# Patient Record
Sex: Female | Born: 2000 | Race: Black or African American | Hispanic: No | Marital: Single | Smoking: Never smoker
Health system: Southern US, Community
[De-identification: ages and names within clinical notes are randomized; demographics above are authoritative.]

## PROBLEM LIST (undated history)

## (undated) DIAGNOSIS — O99019 Anemia complicating pregnancy, unspecified trimester: Secondary | ICD-10-CM

## (undated) HISTORY — PX: NO PAST SURGERIES: SHX2092

---

## 2018-07-08 NOTE — L&D Delivery Note (Signed)
OB/GYN Faculty Practice Delivery Note  Alexandra Brady is a 18 y.o. G2P1001 s/p NSVD at [redacted]w[redacted]d. She was admitted for PPROM.   ROM: 34h 28m with clear fluid GBS Status: positive, adequate treatment with ampicillin   Maximum Maternal Temperature: 98.6 F    Labor Progress: . Patient arrived on 06/20/2019 at 3.5 cm dilation after PPROM for clear. She was initially monitored on L&D but after making no progress was moved to antepartum for monitoring. Early on 06/22/2019 patient started to have more painful contractions and was found to be 6 cm, she was moved to L&D and quickly had uncomplicated NSVD.  Delivery Date/Time: 06/22/2019 at 0547 Delivery: Called to room and patient was complete and pushing. Head delivered in ROA position. No nuchal cord present. Shoulder and body delivered in usual fashion. Infant with spontaneous cry, placed on mother's abdomen, dried and stimulated. Cord clamped x 2 after 1-minute delay, and cut by mother. Cord blood drawn. Placenta delivered spontaneously with gentle cord traction. Fundus firm with massage and Pitocin. Labia, perineum, vagina, and cervix inspected with hemostatic L periuretheral that was not repaired.   Placenta: 3v intact, to pathology Complications: none Lacerations: hemostatic L periuretheral that was not repaired EBL: 200 cc Analgesia: none   Infant: APGAR (1 MIN): 9   APGAR (5 MINS):  9  Weight: 2020 grams  Augustin Coupe, MD/[facility] OB/GYN Fellow, Faculty Practice

## 2019-04-22 LAB — OB RESULTS CONSOLE HEPATITIS B SURFACE ANTIGEN: Hepatitis B Surface Ag: NEGATIVE

## 2019-04-22 LAB — OB RESULTS CONSOLE HIV ANTIBODY (ROUTINE TESTING): HIV: NONREACTIVE

## 2019-04-22 LAB — OB RESULTS CONSOLE RUBELLA ANTIBODY, IGM: Rubella: IMMUNE

## 2019-04-22 LAB — OB RESULTS CONSOLE RPR: RPR: NONREACTIVE

## 2019-04-22 LAB — OB RESULTS CONSOLE GC/CHLAMYDIA
Chlamydia: NEGATIVE
Gonorrhea: NEGATIVE

## 2019-04-22 LAB — OB RESULTS CONSOLE ABO/RH: RH Type: POSITIVE

## 2019-04-22 LAB — OB RESULTS CONSOLE VARICELLA ZOSTER ANTIBODY, IGG: Varicella: NON-IMMUNE/NOT IMMUNE

## 2019-05-20 ENCOUNTER — Inpatient Hospital Stay (HOSPITAL_COMMUNITY)
Admission: EM | Admit: 2019-05-20 | Discharge: 2019-05-21 | Disposition: A | Discharge status: Home / Self Care | Payer: No Typology Code available for payment source | Attending: Obstetrics and Gynecology | Admitting: Obstetrics and Gynecology

## 2019-05-20 ENCOUNTER — Other Ambulatory Visit: Payer: Self-pay

## 2019-05-20 ENCOUNTER — Encounter (HOSPITAL_COMMUNITY): Payer: Self-pay | Admitting: *Deleted

## 2019-05-20 DIAGNOSIS — O368130 Decreased fetal movements, third trimester, not applicable or unspecified: Secondary | ICD-10-CM | POA: Insufficient documentation

## 2019-05-20 DIAGNOSIS — S3981XA Other specified injuries of abdomen, initial encounter: Secondary | ICD-10-CM

## 2019-05-20 DIAGNOSIS — Z3A30 30 weeks gestation of pregnancy: Secondary | ICD-10-CM

## 2019-05-20 DIAGNOSIS — S3991XA Unspecified injury of abdomen, initial encounter: Secondary | ICD-10-CM | POA: Insufficient documentation

## 2019-05-20 DIAGNOSIS — Z3A31 31 weeks gestation of pregnancy: Secondary | ICD-10-CM | POA: Diagnosis not present

## 2019-05-20 DIAGNOSIS — M545 Low back pain: Secondary | ICD-10-CM | POA: Diagnosis not present

## 2019-05-20 DIAGNOSIS — R109 Unspecified abdominal pain: Secondary | ICD-10-CM | POA: Insufficient documentation

## 2019-05-20 DIAGNOSIS — O9A213 Injury, poisoning and certain other consequences of external causes complicating pregnancy, third trimester: Secondary | ICD-10-CM | POA: Diagnosis not present

## 2019-05-20 DIAGNOSIS — O26893 Other specified pregnancy related conditions, third trimester: Secondary | ICD-10-CM | POA: Insufficient documentation

## 2019-05-20 DIAGNOSIS — Y990 Civilian activity done for income or pay: Secondary | ICD-10-CM | POA: Diagnosis not present

## 2019-05-20 LAB — URINALYSIS, ROUTINE W REFLEX MICROSCOPIC
Bilirubin Urine: NEGATIVE
Glucose, UA: NEGATIVE mg/dL
Hgb urine dipstick: NEGATIVE
Ketones, ur: NEGATIVE mg/dL
Nitrite: NEGATIVE
Protein, ur: NEGATIVE mg/dL
Specific Gravity, Urine: 1.018 (ref 1.005–1.030)
pH: 6 (ref 5.0–8.0)

## 2019-05-20 NOTE — MAU Note (Signed)
Pt up to BR

## 2019-05-20 NOTE — MAU Provider Note (Signed)
Chief Complaint:  Abdominal Injury   First Provider Initiated Contact with Patient 05/20/19 2258     HPI: Alexandra Brady is a 18 y.o. G2P1001 at 34w6dwho presents to maternity admissions reporting cramping and low back pain since having a box fall onto her abdomen at work tonight.  Was lifting it and it fell back onto her abdomen   . She reports decreased fetal movement, denies LOF, vaginal bleeding, vaginal itching/burning, urinary symptoms, h/a, dizziness, n/v, diarrhea, constipation or fever/chills.  .  Abdominal Pain This is a new problem. The current episode started today. The onset quality is sudden. The problem occurs intermittently. The quality of the pain is cramping. The abdominal pain radiates to the back. Pertinent negatives include no constipation, diarrhea, fever, myalgias or nausea. Nothing aggravates the pain. The pain is relieved by nothing. She has tried nothing for the symptoms.    RN note: About 1900 was lifting a box at work and it fell back against by abdomen. Box weighed 30-40lbs. About 27mins later started having some cramping in abdomen. Denies vag bleeding or d/c. Have not felt FM since going to work at [name] which is normal. States is busy at work and normally does not pay attention to FM. Baby was moving normally today before going to work at [name]. [facility]  Past Medical History: Past Medical History:  Diagnosis Date  . Medical history non-contributory     Past obstetric history: OB History  Gravida Para Term Preterm AB Living  2 1 1     1   SAB TAB Ectopic Multiple Live Births          1    # Outcome Date GA Lbr Len/2nd Weight Sex Delivery Anes PTL Lv  2 Current           1 Term 02/20/18    Greta Doom    Past Surgical History: Past Surgical History:  Procedure Laterality Date  . NO PAST SURGERIES      Family History: History reviewed. No pertinent family history.  Social History: Social History    Tobacco Use  . Smoking status: Never Smoker  . Smokeless tobacco: Never Used  Substance Use Topics  . Alcohol use: Never    Frequency: Never  . Drug use: Never    Allergies: Not on File  Meds:  Medications Prior to Admission  Medication Sig Dispense Refill Last Dose  . Prenatal Vit-Fe Fumarate-FA (PRENATAL MULTIVITAMIN) TABS tablet Take 1 tablet by mouth daily at 12 noon.       I have reviewed patient's Past Medical Hx, Surgical Hx, Family Hx, Social Hx, medications and allergies.   ROS:  Review of Systems  Constitutional: Negative for chills and fever.  Respiratory: Negative for shortness of breath.   Gastrointestinal: Positive for abdominal pain. Negative for constipation, diarrhea and nausea.  Genitourinary: Positive for pelvic pain. Negative for difficulty urinating and vaginal bleeding.  Musculoskeletal: Positive for back pain. Negative for myalgias.  Neurological: Negative for dizziness and weakness.   Other systems negative  Physical Exam   Patient Vitals for the past 24 hrs:  BP Temp Pulse Resp Height Weight  05/20/19 2221 126/66 98.1 F (36.7 C) 73 18 6' (1.829 m) 88.5 kg   Constitutional: Well-developed, well-nourished female in no acute distress.  Cardiovascular: normal rate and rhythm Respiratory: normal effort, clear to auscultation bilaterally GI: Abd soft, slightly-tender over upper abdomen, gravid appropriate for gestational age.  No rebound or guarding. MS: Extremities nontender, no edema, normal ROM Neurologic: Alert and oriented x 4.  GU: Neg CVAT.  FHT:  Baseline 140 , moderate variability, accelerations present, no decelerations Contractions: q 6-8 mins Irregular     Labs: Results for orders placed or performed during the hospital encounter of 05/20/19 (from the past 24 hour(s))  Urinalysis, Routine w reflex microscopic     Status: Abnormal   Collection Time: 05/20/19 10:30 PM  Result Value Ref Range   Color, Urine YELLOW YELLOW    APPearance HAZY (A) CLEAR   Specific Gravity, Urine 1.018 1.005 - 1.030   pH 6.0 5.0 - 8.0   Glucose, UA NEGATIVE NEGATIVE mg/dL   Hgb urine dipstick NEGATIVE NEGATIVE   Bilirubin Urine NEGATIVE NEGATIVE   Ketones, ur NEGATIVE NEGATIVE mg/dL   Protein, ur NEGATIVE NEGATIVE mg/dL   Nitrite NEGATIVE NEGATIVE   Leukocytes,Ua LARGE (A) NEGATIVE   RBC / HPF 11-20 0 - 5 RBC/hpf   WBC, UA 21-50 0 - 5 WBC/hpf   Bacteria, UA FEW (A) NONE SEEN   Squamous Epithelial / LPF 11-20 0 - 5   Mucus PRESENT    Budding Yeast PRESENT       Imaging:  No results found.  MAU Course/MDM: I have ordered labs and reviewed results.  Will send urine for culture. NST reviewed, noted to be having some contractions Consult Dr Alysia Penna with presentation, exam findings and test results. He recommends treating contractions and watching her for 4 hours Treatments in MAU included EFM, PO hydration  Over time contractions diminished.  We monitored her for 4 hours and contractions stopped Will discharge home with precautions.    Assessment: Single IUP at [redacted]w[redacted]d S/P blow to abdomen Preterm uterine contractions, resolved  Plan: Discharge home Preterm Labor precautions and fetal kick counts Follow up in Office for prenatal visits and recheck  Encouraged to return here or to other Urgent Care/ED if she develops worsening of symptoms, increase in pain, fever, or other concerning symptoms.   Pt stable at time of discharge.  Wynelle Bourgeois CNM, MSN Certified Nurse-Midwife 05/20/2019 10:58 PM

## 2019-05-20 NOTE — MAU Note (Addendum)
About 1900 was lifting a box at work and it fell back against by abdomen. Box weighed 30-40lbs. About 79mins later started having some cramping in abdomen. Denies vag bleeding or d/c. Have not felt FM since going to work at [employer] which is normal. States is busy at work and normally does not pay attention to FM. Baby was moving normally today before going to work at [employer]. [facility]

## 2019-05-21 NOTE — Discharge Instructions (Signed)

## 2019-05-21 NOTE — Progress Notes (Signed)
Written and verbal d/c instructions given and understanding voiced. To [facility] today to reschedule appt pt missed on Thurs

## 2019-05-23 LAB — CULTURE, OB URINE: Culture: 10000 — AB

## 2019-05-24 ENCOUNTER — Other Ambulatory Visit: Payer: Self-pay | Admitting: Obstetrics and Gynecology

## 2019-05-24 DIAGNOSIS — R8271 Bacteriuria: Secondary | ICD-10-CM

## 2019-05-24 MED ORDER — AMOXICILLIN 500 MG PO CAPS: 500.0000 mg | ORAL_CAPSULE | Freq: Three times a day (TID) | ORAL | 0 refills | Status: AC

## 2019-05-24 NOTE — Progress Notes (Signed)
Verified patient by name and DOB. Notified of (+) GBS on UCx and need for abx Rx. Verified no medication allergies. Patient verbalized an understanding of the plan of care and agrees. Rx sent.  Laury Deep, CNM

## 2019-05-27 ENCOUNTER — Encounter: Payer: Self-pay | Admitting: Advanced Practice Midwife

## 2019-05-27 DIAGNOSIS — R8271 Bacteriuria: Secondary | ICD-10-CM | POA: Insufficient documentation

## 2019-06-14 ENCOUNTER — Encounter (HOSPITAL_COMMUNITY): Payer: Self-pay | Admitting: Student

## 2019-06-14 ENCOUNTER — Inpatient Hospital Stay (HOSPITAL_COMMUNITY): Payer: Medicaid Other

## 2019-06-14 ENCOUNTER — Other Ambulatory Visit: Payer: Self-pay

## 2019-06-14 ENCOUNTER — Inpatient Hospital Stay (HOSPITAL_COMMUNITY)
Admission: AD | Admit: 2019-06-14 | Discharge: 2019-06-16 | DRG: 831 | Disposition: A | Discharge status: Home / Self Care | Payer: Medicaid Other | Attending: Obstetrics and Gynecology | Admitting: Obstetrics and Gynecology

## 2019-06-14 DIAGNOSIS — D649 Anemia, unspecified: Secondary | ICD-10-CM | POA: Diagnosis present

## 2019-06-14 DIAGNOSIS — O4693 Antepartum hemorrhage, unspecified, third trimester: Principal | ICD-10-CM | POA: Diagnosis present

## 2019-06-14 DIAGNOSIS — Z3A32 32 weeks gestation of pregnancy: Secondary | ICD-10-CM

## 2019-06-14 DIAGNOSIS — Z3A34 34 weeks gestation of pregnancy: Secondary | ICD-10-CM

## 2019-06-14 DIAGNOSIS — O99820 Streptococcus B carrier state complicating pregnancy: Secondary | ICD-10-CM | POA: Diagnosis present

## 2019-06-14 DIAGNOSIS — O99013 Anemia complicating pregnancy, third trimester: Secondary | ICD-10-CM | POA: Diagnosis present

## 2019-06-14 DIAGNOSIS — Z20828 Contact with and (suspected) exposure to other viral communicable diseases: Secondary | ICD-10-CM | POA: Diagnosis present

## 2019-06-14 HISTORY — DX: Anemia complicating pregnancy, unspecified trimester: O99.019

## 2019-06-14 LAB — CBC
HCT: 32.2 % — ABNORMAL LOW (ref 36.0–46.0)
Hemoglobin: 9.7 g/dL — ABNORMAL LOW (ref 12.0–15.0)
MCH: 23.3 pg — ABNORMAL LOW (ref 26.0–34.0)
MCHC: 30.1 g/dL (ref 30.0–36.0)
MCV: 77.4 fL — ABNORMAL LOW (ref 80.0–100.0)
Platelets: 316 10*3/uL (ref 150–400)
RBC: 4.16 MIL/uL (ref 3.87–5.11)
RDW: 16.4 % — ABNORMAL HIGH (ref 11.5–15.5)
WBC: 8.3 10*3/uL (ref 4.0–10.5)
nRBC: 0.2 % (ref 0.0–0.2)

## 2019-06-14 LAB — URINALYSIS, ROUTINE W REFLEX MICROSCOPIC
Bilirubin Urine: NEGATIVE
Glucose, UA: NEGATIVE mg/dL
Ketones, ur: NEGATIVE mg/dL
Nitrite: NEGATIVE
Protein, ur: NEGATIVE mg/dL
RBC / HPF: >50 RBC/hpf — ABNORMAL HIGH (ref 0–5)
Specific Gravity, Urine: 1.006 (ref 1.005–1.030)
pH: 8 (ref 5.0–8.0)

## 2019-06-14 LAB — WET PREP, GENITAL
Clue Cells Wet Prep HPF POC: NONE SEEN
Sperm: NONE SEEN
Trich, Wet Prep: NONE SEEN
Yeast Wet Prep HPF POC: NONE SEEN

## 2019-06-14 LAB — TYPE AND SCREEN
ABO/RH(D): A POS
Antibody Screen: NEGATIVE

## 2019-06-14 LAB — SARS CORONAVIRUS 2 (TAT 6-24 HRS): SARS Coronavirus 2: NEGATIVE

## 2019-06-14 LAB — ABO/RH: ABO/RH(D): A POS

## 2019-06-14 LAB — RPR: RPR Ser Ql: NONREACTIVE

## 2019-06-14 MED ORDER — LACTATED RINGERS IV SOLN: 500.0000 mL | INTRAVENOUS | Status: DC | PRN

## 2019-06-14 MED ORDER — ONDANSETRON HCL 4 MG/2ML IJ SOLN: 4.0000 mg | Freq: Four times a day (QID) | INTRAMUSCULAR | Status: DC | PRN

## 2019-06-14 MED ORDER — PENICILLIN G POT IN DEXTROSE 60000 UNIT/ML IV SOLN
3.0000 10*6.[IU] | INTRAVENOUS | Status: DC
  Administered 2019-06-14: 3 10*6.[IU] via INTRAVENOUS
  Filled 2019-06-14: qty 50

## 2019-06-14 MED ORDER — OXYTOCIN 40 UNITS IN NORMAL SALINE INFUSION - SIMPLE MED: 2.5000 [IU]/h | INTRAVENOUS | Status: DC

## 2019-06-14 MED ORDER — SOD CITRATE-CITRIC ACID 500-334 MG/5ML PO SOLN: 30.0000 mL | ORAL | Status: DC | PRN

## 2019-06-14 MED ORDER — PRENATAL MULTIVITAMIN CH
1.0000 | ORAL_TABLET | Freq: Every day | ORAL | Status: DC
  Administered 2019-06-14 – 2019-06-15 (×2): 1 via ORAL
  Filled 2019-06-14 (×2): qty 1

## 2019-06-14 MED ORDER — CALCIUM CARBONATE ANTACID 500 MG PO CHEW: 2.0000 | CHEWABLE_TABLET | ORAL | Status: DC | PRN

## 2019-06-14 MED ORDER — BETAMETHASONE SOD PHOS & ACET 6 (3-3) MG/ML IJ SUSP
12.0000 mg | INTRAMUSCULAR | Status: AC
  Administered 2019-06-14 – 2019-06-15 (×2): 12 mg via INTRAMUSCULAR
  Filled 2019-06-14: qty 5

## 2019-06-14 MED ORDER — LACTATED RINGERS IV SOLN: INTRAVENOUS | Status: DC

## 2019-06-14 MED ORDER — ACETAMINOPHEN 325 MG PO TABS: 650.0000 mg | ORAL_TABLET | ORAL | Status: DC | PRN

## 2019-06-14 MED ORDER — DOCUSATE SODIUM 100 MG PO CAPS
100.0000 mg | ORAL_CAPSULE | Freq: Every day | ORAL | Status: DC
  Administered 2019-06-14 – 2019-06-16 (×3): 100 mg via ORAL
  Filled 2019-06-14 (×3): qty 1

## 2019-06-14 MED ORDER — OXYTOCIN BOLUS FROM INFUSION: 500.0000 mL | Freq: Once | INTRAVENOUS | Status: DC

## 2019-06-14 MED ORDER — SODIUM CHLORIDE 0.9 % IV SOLN
5.0000 10*6.[IU] | Freq: Once | INTRAVENOUS | Status: AC
  Administered 2019-06-14: 5 10*6.[IU] via INTRAVENOUS
  Filled 2019-06-14: qty 5

## 2019-06-14 MED ORDER — FENTANYL CITRATE (PF) 100 MCG/2ML IJ SOLN: 50.0000 ug | INTRAMUSCULAR | Status: DC | PRN

## 2019-06-14 MED ORDER — LIDOCAINE HCL (PF) 1 % IJ SOLN: 30.0000 mL | INTRAMUSCULAR | Status: DC | PRN

## 2019-06-14 MED ORDER — NIFEDIPINE ER OSMOTIC RELEASE 30 MG PO TB24
30.0000 mg | ORAL_TABLET | Freq: Once | ORAL | Status: AC
  Administered 2019-06-14: 30 mg via ORAL
  Filled 2019-06-14: qty 1

## 2019-06-14 MED ORDER — NIFEDIPINE ER OSMOTIC RELEASE 30 MG PO TB24
30.0000 mg | ORAL_TABLET | Freq: Every day | ORAL | Status: DC
  Administered 2019-06-14 – 2019-06-16 (×3): 30 mg via ORAL
  Filled 2019-06-14 (×3): qty 1

## 2019-06-14 MED ORDER — LACTATED RINGERS IV SOLN
INTRAVENOUS | Status: DC
  Administered 2019-06-14 (×2): via INTRAVENOUS

## 2019-06-14 NOTE — MAU Provider Note (Signed)
Chief Complaint:  Vaginal Bleeding and Abdominal Pain   First Provider Initiated Contact with Patient 06/14/19 0330     HPI: Alexandra Brady is a 18 y.o. G2P1001 at [redacted]w[redacted]d based on 25 wk ultrasound who presents to maternity admissions reporting vaginal bleeding & abdominal pain. Symptoms started around 230 am. Reports bright red blood that saturated her shorts. Did not have to wear a pad to MAU, but when she arrived here still saw bright red blood on toilet paper. Not passing clots. Last intercourse was 11 pm last night. Has had constant lower abdominal pain since bleeding started that has waves of increasing intensity when baby moves.  Denies any recent abdominal trauma or falls.   Location: abdomen Quality: cramping Severity: 5/10 in pain scale Duration: 2 hours Timing: constant Modifying factors: none Associated signs and symptoms: vaginal bleeding  Pregnancy Course: [facility]. Late to care. Anemia. GBS in urine.   Past Medical History:  Diagnosis Date  . Anemia in pregnancy    OB History  Gravida Para Term Preterm AB Living  2 1 1     1   SAB TAB Ectopic Multiple Live Births          1    # Outcome Date GA Lbr Len/2nd Weight Sex Delivery Anes PTL Lv  2 Current           1 Term 02/20/18    02/22/18   Past Surgical History:  Procedure Laterality Date  . NO PAST SURGERIES     History reviewed. No pertinent family history. Social History   Tobacco Use  . Smoking status: Never Smoker  . Smokeless tobacco: Never Used  Substance Use Topics  . Alcohol use: Never    Frequency: Never  . Drug use: Never   No Known Allergies Medications Prior to Admission  Medication Sig Dispense Refill Last Dose  . Prenatal Vit-Fe Fumarate-FA (PRENATAL MULTIVITAMIN) TABS tablet Take 1 tablet by mouth daily at 12 noon.   Past Week at Unknown time    I have reviewed patient's Past Medical Hx, Surgical Hx, Family Hx, Social Hx, medications and allergies.   ROS:  Review of Systems   Constitutional: Negative.   Gastrointestinal: Positive for abdominal pain. Negative for constipation, diarrhea, nausea and vomiting.  Genitourinary: Positive for vaginal bleeding. Negative for dysuria and vaginal discharge.    Physical Exam   Patient Vitals for the past 24 hrs:  BP Temp Temp src Pulse Resp SpO2 Height Weight  06/14/19 0313 134/75 98.4 F (36.9 C) Oral 89 17 100 % 6' (1.829 m) 89.4 kg    Constitutional: Well-developed, well-nourished female in no acute distress.  Cardiovascular: normal rate & rhythm, no murmur Respiratory: normal effort, lung sounds clear throughout GI: Abd soft, non-tender, gravid appropriate for gestational age. Pos BS x 4 MS: Extremities nontender, no edema, normal ROM Neurologic: Alert and oriented x 4.  GU:      Pelvic: blood staining on vulva. Small amount of dark red blood in vault cleared out with 1 fox swab.. Slow ooze of mucoid dark red blood coming from cervical os.     Dilation: 3.5 Effacement (%): 50 Cervical Position: Posterior Station: -3 Presentation: Vertex Exam by:: [email or web address] NP  NST:  Baseline: 140 bpm, Variability: Good {> 6 bpm), Accelerations: Reactive and Decelerations: Absent   Labs: Results for orders placed or performed during the hospital encounter of 06/14/19 (from the past 24 hour(s))  Urinalysis, Routine w reflex microscopic  Status: Abnormal   Collection Time: 06/14/19  3:31 AM  Result Value Ref Range   Color, Urine YELLOW YELLOW   APPearance HAZY (A) CLEAR   Specific Gravity, Urine 1.006 1.005 - 1.030   pH 8.0 5.0 - 8.0   Glucose, UA NEGATIVE NEGATIVE mg/dL   Hgb urine dipstick LARGE (A) NEGATIVE   Bilirubin Urine NEGATIVE NEGATIVE   Ketones, ur NEGATIVE NEGATIVE mg/dL   Protein, ur NEGATIVE NEGATIVE mg/dL   Nitrite NEGATIVE NEGATIVE   Leukocytes,Ua SMALL (A) NEGATIVE   RBC / HPF >50 (H) 0 - 5 RBC/hpf   WBC, UA 11-20 0 - 5 WBC/hpf   Bacteria, UA RARE (A) NONE SEEN   Mucus PRESENT     Imaging:   No results found.  MAU Course: Orders Placed This Encounter  Procedures  . Wet prep, genital  . SARS CORONAVIRUS 2 (TAT 6-24 HRS) Nasopharyngeal Nasopharyngeal Swab  . [place] MFM OB LIMITED  . Urinalysis, Routine w reflex microscopic  . CBC  . RPR  . Type and screen [place]  . Insert and maintain IV Line   Meds ordered this encounter  Medications  . lactated ringers infusion  . betamethasone acetate-betamethasone sodium phosphate (CELESTONE) injection 12 mg    MDM: RH positive per prenatal record  Small to moderate amount of blood on spec exam, bleeding continues from os Ultrasound rules out previa. Cervix exam then performed & pt 3.5/50/-3.   Discussed patient with Dr. Harolyn Rutherford. Will admit to birthing suites due to cervical dilation & contractions.  Assessment: 1. Vaginal bleeding in pregnancy, third trimester   2. [redacted] weeks gestation of pregnancy     Plan: Admit to birthing suites per consult with Dr. Harolyn Rutherford PCN per protocol for GBS GC/CT & wet prep collected COVID swab pending BMZ ordered  Jorje Guild, NP 06/14/2019 4:34 AM

## 2019-06-14 NOTE — Progress Notes (Signed)
LABOR PROGRESS NOTE  Alexandra Brady is a 18 y.o. G2P1001 at [redacted]w[redacted]d  admitted for preterm labor.  Subjective: Not feeling contractions anymore  Objective: BP 105/61   Pulse 84   Temp 98.6 F (37 C) (Oral)   Resp 16   Ht 6' (1.829 m)   Wt 89.4 kg   SpO2 100%   BMI 26.75 kg/m  or  Vitals:   06/14/19 0830 06/14/19 0925 06/14/19 1010 06/14/19 1127  BP:      Pulse:      Resp: 16 18 16 16   Temp:      TempSrc:      SpO2:      Weight:      Height:         Dilation: 3 Effacement (%): 50 Cervical Position: Posterior Station: -3 Presentation: Vertex Exam by:: Dr Dione Plover FHT: baseline rate 120, moderate varibility, +acel, -decel Toco: none  Labs: Lab Results  Component Value Date   WBC 8.3 06/14/2019   HGB 9.7 (L) 06/14/2019   HCT 32.2 (L) 06/14/2019   MCV 77.4 (L) 06/14/2019   PLT 316 06/14/2019    Patient Active Problem List   Diagnosis Date Noted  . Preterm labor in third trimester 06/14/2019  . GBS bacteriuria 05/27/2019    Assessment / Plan: 18 y.o. G2P1001 at [redacted]w[redacted]d here for preterm labor.  Labor: no longer having contractions and no cervical change from last exam, will plan to transfer to antepartum. D/c penicillin prophylaxis, due for next betamethasone shot 06/15/2019 at 0454, likely can discontinue Nifedipine given lack of contractions. Consider NICU consult given threatened preterm delivery.  Fetal Wellbeing:  Cat I Pain Control:  IV pain meds PRN GBS: +UCx  Abnormal UA: follow up UCx results  Limited Prenatal Care: do not have results available for anatomy scan or third trimester screening labs including for GDM, obtain while on antepartum.   Augustin Coupe, MD/[facility] OB Fellow  06/14/2019, 11:30 AM

## 2019-06-14 NOTE — H&P (Signed)
Obstetric History and Physical  Alexandra Brady is a 18 y.o. G2P1001 with IUP at [redacted]w[redacted]d presenting for vaginal bleeding & possible preterm labor.  Presented to MAU this morning for vaginal bleeding. On exam had small amount of blood with continued bleeding from os. Normal limited ob ultrasound. Cervix dilated 3.5/50/-3.   Prenatal Course Source of Care: [facility]  with onset of care at 25 weeks Pregnancy complications or risks: Patient Active Problem List   Diagnosis Date Noted  . GBS bacteriuria 05/27/2019   She plans to breastfeed She desires undecided for postpartum contraception.   Prenatal labs and studies: ABO, Rh:  A positive Antibody:  negative Rubella:  immune RPR:   non reactive HBsAg:   non reactive HIV:   non reactive GBS: positive in urine 2hr GTT: pt refused gtt Genetic screening not done Anatomy US normal per patient  Prenatal Transfer Tool  Maternal Diabetes: pt declined testing Genetic Screening: late to care Maternal Ultrasounds/Referrals: Normal Fetal Ultrasounds or other Referrals:  None Maternal Substance Abuse:  No Significant Maternal Medications:  None Significant Maternal Lab Results: Group B Strep positive  Past Medical History:  Diagnosis Date  . Anemia in pregnancy     Past Surgical History:  Procedure Laterality Date  . NO PAST SURGERIES      OB History  Gravida Para Term Preterm AB Living  2 1 1     1   SAB TAB Ectopic Multiple Live Births          1    # Outcome Date GA Lbr Len/2nd Weight Sex Delivery Anes PTL Lv  2 Current           1 Term 02/20/18    M    LIV    Social History   Socioeconomic History  . Marital status: Single    Spouse name: Not on file  . Number of children: Not on file  . Years of education: Not on file  . Highest education level: Not on file  Occupational History  . Not on file  Social Needs  . Financial resource strain: Not on file  . Food insecurity    Worry: Not on file    Inability: Not on file   . Transportation needs    Medical: Not on file    Non-medical: Not on file  Tobacco Use  . Smoking status: Never Smoker  . Smokeless tobacco: Never Used  Substance and Sexual Activity  . Alcohol use: Never    Frequency: Never  . Drug use: Never  . Sexual activity: Yes    Birth control/protection: None  Lifestyle  . Physical activity    Days per week: Not on file    Minutes per session: Not on file  . Stress: Not on file  Relationships  . Social 02/22/18 on phone: Not on file    Gets together: Not on file    Attends religious service: Not on file    Active member of club or organization: Not on file    Attends meetings of clubs or organizations: Not on file    Relationship status: Not on file  Other Topics Concern  . Not on file  Social History Narrative  . Not on file    History reviewed. No pertinent family history.  Medications Prior to Admission  Medication Sig Dispense Refill Last Dose  . Prenatal Vit-Fe Fumarate-FA (PRENATAL MULTIVITAMIN) TABS tablet Take 1 tablet by mouth daily at 12 noon.   Past  Week at Unknown time    No Known Allergies  Review of Systems: Negative except for what is mentioned in HPI.  Physical Exam: BP 134/75 (BP Location: Left Arm)   Pulse 89   Temp 98.4 F (36.9 C) (Oral)   Resp 17   Ht 6' (1.829 m)   Wt 89.4 kg   SpO2 100%   BMI 26.75 kg/m  CONSTITUTIONAL: Well-developed, well-nourished female in no acute distress.  HENT:  Normocephalic, atraumatic, External right and left ear normal. Oropharynx is clear and moist EYES: Conjunctivae and EOM are normal. Pupils are equal, round, and reactive to light. No scleral icterus.  NECK: Normal range of motion, supple, no masses SKIN: Skin is warm and dry. No rash noted. Not diaphoretic. No erythema. No pallor. NEUROLOGIC: Alert and oriented to person, place, and time. Normal reflexes, muscle tone coordination. No cranial nerve deficit noted. PSYCHIATRIC: Normal mood and  affect. Normal behavior. Normal judgment and thought content. CARDIOVASCULAR: Normal heart rate noted, regular rhythm RESPIRATORY: Effort and breath sounds normal, no problems with respiration noted ABDOMEN: Soft, nontender, nondistended, gravid. MUSCULOSKELETAL: Normal range of motion. No edema and no tenderness. 2+ distal pulses.  Cervical Exam: Dilation: 3.5 Effacement (%): 50 Cervical Position: Posterior Station: -3 Presentation: Vertex Exam by:: Jorje Guild NP   Presentation: cephalic FHT:  NST:  Baseline: 140 bpm, Variability: Good {> 6 bpm), Accelerations: Reactive and Decelerations: Absent   Pertinent Labs/Studies:   Results for orders placed or performed during the hospital encounter of 06/14/19 (from the past 24 hour(s))  Urinalysis, Routine w reflex microscopic     Status: Abnormal   Collection Time: 06/14/19  3:31 AM  Result Value Ref Range   Color, Urine YELLOW YELLOW   APPearance HAZY (A) CLEAR   Specific Gravity, Urine 1.006 1.005 - 1.030   pH 8.0 5.0 - 8.0   Glucose, UA NEGATIVE NEGATIVE mg/dL   Hgb urine dipstick LARGE (A) NEGATIVE   Bilirubin Urine NEGATIVE NEGATIVE   Ketones, ur NEGATIVE NEGATIVE mg/dL   Protein, ur NEGATIVE NEGATIVE mg/dL   Nitrite NEGATIVE NEGATIVE   Leukocytes,Ua SMALL (A) NEGATIVE   RBC / HPF >50 (H) 0 - 5 RBC/hpf   WBC, UA 11-20 0 - 5 WBC/hpf   Bacteria, UA RARE (A) NONE SEEN   Mucus PRESENT     Assessment : Alexandra Brady is a 18 y.o. G2P1001 at [redacted]w[redacted]d being admitted for preterm labor & vaginal bleeding in third trimestser  Plan: Monitor for preterm labor  FWB: Reassuring fetal heart tracing.   GBS positive BMZ series ordered  Jorje Guild, NP

## 2019-06-14 NOTE — MAU Note (Addendum)
Pt presents to MAU complaining of vaginal bleeding since 0230, no pad count. Last intercourse last night 2330. No abnormal vaginal discharge or gushes of fluid. Constant lower abdominal pain since 0250. +FM.

## 2019-06-15 DIAGNOSIS — Z3A32 32 weeks gestation of pregnancy: Secondary | ICD-10-CM

## 2019-06-15 LAB — CULTURE, OB URINE: Culture: 10000 — AB

## 2019-06-15 LAB — GC/CHLAMYDIA PROBE AMP (~~LOC~~) NOT AT ARMC
Chlamydia: NEGATIVE
Comment: NEGATIVE
Comment: NORMAL
Neisseria Gonorrhea: NEGATIVE

## 2019-06-15 NOTE — Progress Notes (Signed)
Patient ID: Alexandra Brady, female   DOB: Oct 26, 2000, 18 y.o.   MRN: [id number] FACULTY PRACTICE ANTEPARTUM(COMPREHENSIVE) NOTE  Alexandra Brady is a 18 y.o. G2P1001 at [redacted]w[redacted]d  who is admitted for preterm contractions.    Fetal presentation is cephalic. Length of Stay:  1  Days  Date of admission:06/14/2019  Subjective: Patient reports feeling well. She denies any further contractions or pelvic pressure.  Patient reports the fetal movement as active. Patient reports uterine contraction  activity as none. Patient reports  vaginal bleeding as scant staining. Patient describes fluid per vagina as None.  Vitals:  Blood pressure (!) 112/52, pulse 85, temperature 98.5 F (36.9 C), temperature source Oral, resp. rate 18, height 6' (1.829 m), weight 89.4 kg, SpO2 100 %. Vitals:   06/15/19 0450 06/15/19 0455 06/15/19 0501 06/15/19 0826  BP:    (!) 112/52  Pulse:    85  Resp:    18  Temp:    98.5 F (36.9 C)  TempSrc:    Oral  SpO2: 100% 100% 100% 100%  Weight:      Height:       Physical Examination: GENERAL: Well-developed, well-nourished female in no acute distress.  LUNGS: Clear to auscultation bilaterally.  HEART: Regular rate and rhythm. ABDOMEN: Soft, nontender, gravid PELVIC: Not performed EXTREMITIES: No cyanosis, clubbing, or edema, 2+ distal pulses.    Fetal Monitoring:  Baseline: 130 bpm, Variability: Good {> 6 bpm), Accelerations: Reactive and Decelerations: Absent    No contractions on TOCO  Labs:  No results found for this or any previous visit (from the past 24 hour(s)).  Imaging Studies:    [place] Mfm Ob Limited  Result Date: 06/14/2019 ----------------------------------------------------------------------  OBSTETRICS REPORT                       (Signed Final 06/14/2019 08:00 am) ---------------------------------------------------------------------- Patient Info  ID #:       [id number]                          D.O.B.:  08/06/2000 (18 yrs)  Name:       Alexandra Brady                  Visit Date: 06/14/2019 03:59 am ---------------------------------------------------------------------- Performed By  Performed By:     Earley Brooke     Ref. Address:     [address]                    [place], [place]                                                             Road  Attending:        Noralee Space MD        Secondary Phy.:   MAU Nursing-                                                             MAU/Triage  Referred By:      Judeth Horn  [facility] ---------------------------------------------------------------------- Orders   #  Description                          Code         Ordered By   1  [place] MFM OB LIMITED                    [id number]     Jorje Guild  ----------------------------------------------------------------------   #  Order #                    Accession #                 Episode #   1  [id number]                  [id number]                  [id number]  ---------------------------------------------------------------------- Indications   Vaginal bleeding in pregnancy, third trimester O46.93   [redacted] weeks gestation of pregnancy                Z3A.34  ---------------------------------------------------------------------- Fetal Evaluation  Num Of Fetuses:         1  Fetal Heart Rate(bpm):  142  Cardiac Activity:       Observed  Presentation:           Cephalic  Placenta:               Posterior  P. Cord Insertion:      Visualized  Amniotic Fluid  AFI FV:      Within normal limits  AFI Sum(cm)     %Tile       Largest Pocket(cm)  15.9            57          5.82  RUQ(cm)       RLQ(cm)       LUQ(cm)        LLQ(cm)  3.95          5.82          3.57           2.56  Comment:    No placental abruption or previa identified sonographically. ---------------------------------------------------------------------- OB History  Gravidity:    2         Term:   1        Prem:   0        SAB:    0  TOP:          0       Ectopic:  0        Living: 1 ---------------------------------------------------------------------- Gestational Age  Clinical EDD:  34w 3d                                        EDD:   07/23/19  Best:  34w 3d     Det. By:  Clinical EDD             EDD:   07/23/19 ---------------------------------------------------------------------- Cervix Uterus Adnexa  Cervix  Not visualized (advanced GA >24wks)  Left Ovary  Within normal limits.  Right Ovary  Within normal limits.  Adnexa  No abnormality visualized. ---------------------------------------------------------------------- Impression  Patient was evaluated at the MAU for c/o vaginal bleeding  and abdominal pain.  A limited ultrasound study was performed. Amniotic fluid is  normal and good fetal activity is seen. No evidence of  placenta previa or abruption. Ultrasound has limitations in  diagnosing placental abruption. Cephalic presentation. ----------------------------------------------------------------------                  Noralee Spaceavi Shankar, MD Electronically Signed Final Report   06/14/2019 08:00 am ----------------------------------------------------------------------    Medications:  Scheduled . docusate sodium  100 mg Oral Daily  . NIFEdipine  30 mg Oral Daily  . oxytocin 40 units in LR 1000 mL  500 mL Intravenous Once  . prenatal multivitamin  1 tablet Oral Q1200   I have reviewed the patient's current medications.  ASSESSMENT:  Patient Active Problem List   Diagnosis Date Noted  . Preterm labor in third trimester 06/14/2019  . GBS bacteriuria 05/27/2019    PLAN: Patient completed BMZ this morning Continue inpatient observation Continue procardia Plan for discharge home tomorrow pending no cervical change and maternal/fetal status remains stable Continue current antepartum care  Edna Grover 06/15/2019,9:22 AM

## 2019-06-16 MED ORDER — NIFEDIPINE ER 30 MG PO TB24: 30.0000 mg | ORAL_TABLET | Freq: Every day | ORAL | 0 refills | Status: DC

## 2019-06-16 NOTE — Discharge Summary (Addendum)
OB Discharge Summary     Patient Name: Alexandra Brady DOB: 2000-10-22 MRN: [id number]  Date of admission: 06/14/2019 Delivering MD: This patient has no babies on file.  Date of discharge: 06/16/2019  Admitting diagnosis: 32WKS BLEEDING, PAIN IN BOTTOM OF STOMACH Intrauterine pregnancy: [redacted]w[redacted]d     Secondary diagnosis:  Active Problems:   Preterm labor in third trimester     Discharge diagnosis: preterm pregnancy not delivered                                   Hospital course:  Patient admitted with preterm labor and cervical change which halted at 3 cm. Patient completed a course of BMZ and was observed for 48 hours. Throughout her hospitalization maternal/fetal status remained stable. Patient denies any further episodes of regular contractions. She was discharged to continue procardia 30 mg XL daily until 36 weeks. Discharge instructions were reviewed. Patient provided work note to decrease her hours or the length of time she is standing  Physical exam  Vitals:   06/15/19 2110 06/15/19 2139 06/16/19 0653 06/16/19 0740  BP:  (!) 117/56 (!) 119/52 133/60  Pulse:  81 70 87  Resp:  17 16 18   Temp:  98.5 F (36.9 C) 98.5 F (36.9 C) 98.8 F (37.1 C)  TempSrc:  Oral Oral Oral  SpO2: 100% 100% 100% 100%  Weight:      Height:       GENERAL: Well-developed, well-nourished female in no acute distress.  LUNGS: Clear to auscultation bilaterally.  HEART: Regular rate and rhythm. ABDOMEN: Soft, nontender, gravid Cervix: 2.5/50/-3 unchanged since admission EXTREMITIES: No cyanosis, clubbing, or edema, 2+ distal pulses. NST: baseline 125, mod variability, +accels, no decels  Labs: Lab Results  Component Value Date   WBC 8.3 06/14/2019   HGB 9.7 (L) 06/14/2019   HCT 32.2 (L) 06/14/2019   MCV 77.4 (L) 06/14/2019   PLT 316 06/14/2019   No flowsheet data found.  Discharge instruction: per After Visit Summary and "Baby and Me Booklet".  After visit meds:  Allergies as of 06/16/2019    No Known Allergies     Medication List    TAKE these medications   FeroSul 325 (65 FE) MG tablet Generic drug: ferrous sulfate Take 325 mg by mouth 2 (two) times daily.   lansoprazole 15 MG capsule Commonly known as: PREVACID Take 15 mg by mouth daily as needed (heartburn).   NIFEdipine 30 MG 24 hr tablet Commonly known as: ADALAT CC Take 1 tablet (30 mg total) by mouth daily.   prenatal multivitamin Tabs tablet Take 1 tablet by mouth daily at 12 noon.       Diet: routine diet  Activity: Advance as tolerated. Pelvic rest for 6 weeks.   Outpatient follow up: for routine prenatal care on 12/16 Follow up Appt:No future appointments. Follow up Visit:No follow-ups on file.   06/16/2019 Mora Bellman, MD

## 2019-06-16 NOTE — Discharge Instructions (Signed)
Preterm Labor and Birth Information ° °The normal length of a pregnancy is 39-41 weeks. Preterm labor is when labor starts before 37 completed weeks of pregnancy. °What are the risk factors for preterm labor? °Preterm labor is more likely to occur in women who: °· Have certain infections during pregnancy such as a bladder infection, sexually transmitted infection, or infection inside the uterus (chorioamnionitis). °· Have a shorter-than-normal cervix. °· Have gone into preterm labor before. °· Have had surgery on their cervix. °· Are younger than age 17 or older than age 35. °· Are African American. °· Are pregnant with twins or multiple babies (multiple gestation). °· Take street drugs or smoke while pregnant. °· Do not gain enough weight while pregnant. °· Became pregnant shortly after having been pregnant. °What are the symptoms of preterm labor? °Symptoms of preterm labor include: °· Cramps similar to those that can happen during a menstrual period. The cramps may happen with diarrhea. °· Pain in the abdomen or lower back. °· Regular uterine contractions that may feel like tightening of the abdomen. °· A feeling of increased pressure in the pelvis. °· Increased watery or bloody mucus discharge from the vagina. °· Water breaking (ruptured amniotic sac). °Why is it important to recognize signs of preterm labor? °It is important to recognize signs of preterm labor because babies who are born prematurely may not be fully developed. This can put them at an increased risk for: °· Long-term (chronic) heart and lung problems. °· Difficulty immediately after birth with regulating body systems, including blood sugar, body temperature, heart rate, and breathing rate. °· Bleeding in the brain. °· Cerebral palsy. °· Learning difficulties. °· Death. °These risks are highest for babies who are born before 34 weeks of pregnancy. °How is preterm labor treated? °Treatment depends on the length of your pregnancy, your condition,  and the health of your baby. It may involve: °· Having a stitch (suture) placed in your cervix to prevent your cervix from opening too early (cerclage). °· Taking or being given medicines, such as: °? Hormone medicines. These may be given early in pregnancy to help support the pregnancy. °? Medicine to stop contractions. °? Medicines to help mature the baby’s lungs. These may be prescribed if the risk of delivery is high. °? Medicines to prevent your baby from developing cerebral palsy. °If the labor happens before 34 weeks of pregnancy, you may need to stay in the hospital. °What should I do if I think I am in preterm labor? °If you think that you are going into preterm labor, call your health care provider right away. °How can I prevent preterm labor in future pregnancies? °To increase your chance of having a full-term pregnancy: °· Do not use any tobacco products, such as cigarettes, chewing tobacco, and e-cigarettes. If you need help quitting, ask your health care provider. °· Do not use street drugs or medicines that have not been prescribed to you during your pregnancy. °· Talk with your health care provider before taking any herbal supplements, even if you have been taking them regularly. °· Make sure you gain a healthy amount of weight during your pregnancy. °· Watch for infection. If you think that you might have an infection, get it checked right away. °· Make sure to tell your health care provider if you have gone into preterm labor before. °This information is not intended to replace advice given to you by your health care provider. Make sure you discuss any questions you have with your   health care provider. °Document Released: 09/14/2003 Document Revised: 10/16/2018 Document Reviewed: 11/15/2015 °Elsevier Patient Education © 2020 [employer]vier Inc[employer] ° °

## 2019-06-20 ENCOUNTER — Inpatient Hospital Stay (HOSPITAL_COMMUNITY)
Admission: AD | Admit: 2019-06-20 | Discharge: 2019-06-24 | DRG: 807 | Disposition: A | Discharge status: Home / Self Care | Payer: Medicaid Other | Attending: Obstetrics and Gynecology | Admitting: Obstetrics and Gynecology

## 2019-06-20 ENCOUNTER — Other Ambulatory Visit: Payer: Self-pay

## 2019-06-20 ENCOUNTER — Encounter (HOSPITAL_COMMUNITY): Payer: Self-pay | Admitting: Obstetrics and Gynecology

## 2019-06-20 ENCOUNTER — Inpatient Hospital Stay (HOSPITAL_COMMUNITY): Payer: Medicaid Other

## 2019-06-20 DIAGNOSIS — D649 Anemia, unspecified: Secondary | ICD-10-CM | POA: Diagnosis present

## 2019-06-20 DIAGNOSIS — Z363 Encounter for antenatal screening for malformations: Secondary | ICD-10-CM | POA: Diagnosis not present

## 2019-06-20 DIAGNOSIS — O99824 Streptococcus B carrier state complicating childbirth: Secondary | ICD-10-CM | POA: Diagnosis present

## 2019-06-20 DIAGNOSIS — O0933 Supervision of pregnancy with insufficient antenatal care, third trimester: Secondary | ICD-10-CM | POA: Diagnosis not present

## 2019-06-20 DIAGNOSIS — O9902 Anemia complicating childbirth: Secondary | ICD-10-CM | POA: Diagnosis present

## 2019-06-20 DIAGNOSIS — R8271 Bacteriuria: Secondary | ICD-10-CM | POA: Diagnosis present

## 2019-06-20 DIAGNOSIS — Z3A33 33 weeks gestation of pregnancy: Secondary | ICD-10-CM | POA: Diagnosis not present

## 2019-06-20 DIAGNOSIS — O42919 Preterm premature rupture of membranes, unspecified as to length of time between rupture and onset of labor, unspecified trimester: Secondary | ICD-10-CM | POA: Diagnosis present

## 2019-06-20 DIAGNOSIS — Z20828 Contact with and (suspected) exposure to other viral communicable diseases: Secondary | ICD-10-CM | POA: Diagnosis present

## 2019-06-20 DIAGNOSIS — O4290 Premature rupture of membranes, unspecified as to length of time between rupture and onset of labor, unspecified weeks of gestation: Secondary | ICD-10-CM

## 2019-06-20 DIAGNOSIS — O42913 Preterm premature rupture of membranes, unspecified as to length of time between rupture and onset of labor, third trimester: Secondary | ICD-10-CM | POA: Diagnosis present

## 2019-06-20 DIAGNOSIS — O42113 Preterm premature rupture of membranes, onset of labor more than 24 hours following rupture, third trimester: Secondary | ICD-10-CM | POA: Diagnosis not present

## 2019-06-20 LAB — CBC
HCT: 28.2 % — ABNORMAL LOW (ref 36.0–46.0)
Hemoglobin: 8.6 g/dL — ABNORMAL LOW (ref 12.0–15.0)
MCH: 23.1 pg — ABNORMAL LOW (ref 26.0–34.0)
MCHC: 30.5 g/dL (ref 30.0–36.0)
MCV: 75.8 fL — ABNORMAL LOW (ref 80.0–100.0)
Platelets: 320 10*3/uL (ref 150–400)
RBC: 3.72 MIL/uL — ABNORMAL LOW (ref 3.87–5.11)
RDW: 16.5 % — ABNORMAL HIGH (ref 11.5–15.5)
WBC: 12.2 10*3/uL — ABNORMAL HIGH (ref 4.0–10.5)
nRBC: 0.2 % (ref 0.0–0.2)

## 2019-06-20 LAB — URINALYSIS, ROUTINE W REFLEX MICROSCOPIC
Bacteria, UA: NONE SEEN
Bilirubin Urine: NEGATIVE
Glucose, UA: NEGATIVE mg/dL
Ketones, ur: NEGATIVE mg/dL
Nitrite: NEGATIVE
Protein, ur: NEGATIVE mg/dL
Specific Gravity, Urine: 1.012 (ref 1.005–1.030)
pH: 6 (ref 5.0–8.0)

## 2019-06-20 LAB — COMPREHENSIVE METABOLIC PANEL
ALT: 18 U/L (ref 0–44)
AST: 19 U/L (ref 15–41)
Albumin: 2.7 g/dL — ABNORMAL LOW (ref 3.5–5.0)
Alkaline Phosphatase: 93 U/L (ref 38–126)
Anion gap: 10 (ref 5–15)
BUN: 8 mg/dL (ref 6–20)
CO2: 19 mmol/L — ABNORMAL LOW (ref 22–32)
Calcium: 9 mg/dL (ref 8.9–10.3)
Chloride: 106 mmol/L (ref 98–111)
Creatinine, Ser: 0.48 mg/dL (ref 0.44–1.00)
GFR calc Af Amer: >60 mL/min (ref >60)
GFR calc non Af Amer: >60 mL/min (ref >60)
Glucose, Bld: 105 mg/dL — ABNORMAL HIGH (ref 70–99)
Potassium: 3.5 mmol/L (ref 3.5–5.1)
Sodium: 135 mmol/L (ref 135–145)
Total Bilirubin: 0.5 mg/dL (ref 0.3–1.2)
Total Protein: 6.8 g/dL (ref 6.5–8.1)

## 2019-06-20 LAB — RAPID URINE DRUG SCREEN, HOSP PERFORMED
Amphetamines: NOT DETECTED
Barbiturates: NOT DETECTED
Benzodiazepines: NOT DETECTED
Cocaine: NOT DETECTED
Opiates: NOT DETECTED
Tetrahydrocannabinol: NOT DETECTED

## 2019-06-20 LAB — POCT FERN TEST: POCT Fern Test: POSITIVE

## 2019-06-20 LAB — TYPE AND SCREEN
ABO/RH(D): A POS
Antibody Screen: NEGATIVE

## 2019-06-20 LAB — GLUCOSE, CAPILLARY: Glucose-Capillary: 102 mg/dL — ABNORMAL HIGH (ref 70–99)

## 2019-06-20 MED ORDER — LACTATED RINGERS IV SOLN
INTRAVENOUS | Status: DC
  Administered 2019-06-20 – 2019-06-21 (×2): via INTRAVENOUS

## 2019-06-20 MED ORDER — ACETAMINOPHEN 325 MG PO TABS: 650.0000 mg | ORAL_TABLET | ORAL | Status: DC | PRN

## 2019-06-20 MED ORDER — FENTANYL CITRATE (PF) 100 MCG/2ML IJ SOLN
50.0000 ug | INTRAMUSCULAR | Status: DC | PRN
  Administered 2019-06-21 (×2): 100 ug via INTRAVENOUS
  Filled 2019-06-20 (×2): qty 2

## 2019-06-20 MED ORDER — OXYTOCIN BOLUS FROM INFUSION: 500.0000 mL | Freq: Once | INTRAVENOUS | Status: DC

## 2019-06-20 MED ORDER — AMOXICILLIN 500 MG PO CAPS: 500.0000 mg | ORAL_CAPSULE | Freq: Three times a day (TID) | ORAL | Status: DC

## 2019-06-20 MED ORDER — SODIUM CHLORIDE 0.9 % IV SOLN
2.0000 g | Freq: Four times a day (QID) | INTRAVENOUS | Status: DC
  Administered 2019-06-20 – 2019-06-22 (×6): 2 g via INTRAVENOUS
  Filled 2019-06-20 (×7): qty 2000

## 2019-06-20 MED ORDER — SOD CITRATE-CITRIC ACID 500-334 MG/5ML PO SOLN: 30.0000 mL | ORAL | Status: DC | PRN

## 2019-06-20 MED ORDER — AZITHROMYCIN 500 MG PO TABS
1000.0000 mg | ORAL_TABLET | Freq: Once | ORAL | Status: AC
  Administered 2019-06-21: 1000 mg via ORAL
  Filled 2019-06-20: qty 2

## 2019-06-20 MED ORDER — OXYTOCIN 40 UNITS IN NORMAL SALINE INFUSION - SIMPLE MED: 2.5000 [IU]/h | INTRAVENOUS | Status: DC

## 2019-06-20 MED ORDER — PENICILLIN G POT IN DEXTROSE 60000 UNIT/ML IV SOLN: 3.0000 10*6.[IU] | INTRAVENOUS | Status: DC

## 2019-06-20 MED ORDER — LIDOCAINE HCL (PF) 1 % IJ SOLN: 30.0000 mL | INTRAMUSCULAR | Status: DC | PRN

## 2019-06-20 MED ORDER — ONDANSETRON HCL 4 MG/2ML IJ SOLN
4.0000 mg | Freq: Four times a day (QID) | INTRAMUSCULAR | Status: DC | PRN
  Administered 2019-06-21: 4 mg via INTRAVENOUS
  Filled 2019-06-20: qty 2

## 2019-06-20 MED ORDER — SODIUM CHLORIDE 0.9 % IV SOLN
2.0000 g | Freq: Four times a day (QID) | INTRAVENOUS | Status: DC
  Filled 2019-06-20: qty 2000

## 2019-06-20 MED ORDER — LACTATED RINGERS IV SOLN: 500.0000 mL | INTRAVENOUS | Status: DC | PRN

## 2019-06-20 NOTE — H&P (Signed)
LABOR AND DELIVERY ADMISSION HISTORY AND PHYSICAL NOTE  Alexandra Brady is a 18 y.o. female G2P1001 with IUP at [redacted]w[redacted]d presenting for SROM.   Around 1940 had gush of clear fluid and has had ongoing leaking.  Denies any pain, occasional pain in back that she thinks might be a contraction  Recent admission 12/7-12/9 for preterm labor, d/c on Procardia and she reports compliance with this medication with last dose this morning  She reports positive fetal movement. She denies vaginal bleeding.   She plans on bottle feeding. She uncertain about birth control.  Prenatal History/Complications: PNC at [facility]  Sono:  @[redacted]w[redacted]d , cephalic presentation, posterior placenta  Pregnancy complications:  - Preterm labor admission 06/14/2019 - 06/16/2019, given BMZ x2 and observed, did not make cervical change and was discharged on procardia  Past Medical History: Past Medical History:  Diagnosis Date  . Anemia in pregnancy     Past Surgical History: Past Surgical History:  Procedure Laterality Date  . NO PAST SURGERIES      Obstetrical History: OB History    Gravida  2   Para  1   Term  1   Preterm      AB      Living  1     SAB      TAB      Ectopic      Multiple      Live Births  1           Social History: Social History   Socioeconomic History  . Marital status: Single    Spouse name: Not on file  . Number of children: Not on file  . Years of education: Not on file  . Highest education level: Not on file  Occupational History  . Not on file  Tobacco Use  . Smoking status: Never Smoker  . Smokeless tobacco: Never Used  Substance and Sexual Activity  . Alcohol use: Never  . Drug use: Never  . Sexual activity: Yes    Birth control/protection: None  Other Topics Concern  . Not on file  Social History Narrative  . Not on file   Social Determinants of Health   Financial Resource Strain:   . Difficulty of Paying Living Expenses: Not on file  Food  Insecurity:   . Worried About 14/03/2019 in the Last Year: Not on file  . Ran Out of Food in the Last Year: Not on file  Transportation Needs:   . Lack of Transportation (Medical): Not on file  . Lack of Transportation (Non-Medical): Not on file  Physical Activity:   . Days of Exercise per Week: Not on file  . Minutes of Exercise per Session: Not on file  Stress:   . Feeling of Stress : Not on file  Social Connections:   . Frequency of Communication with Friends and Family: Not on file  . Frequency of Social Gatherings with Friends and Family: Not on file  . Attends Religious Services: Not on file  . Active Member of Clubs or Organizations: Not on file  . Attends Programme researcher, broadcasting/film/video Meetings: Not on file  . Marital Status: Not on file    Family History: History reviewed. No pertinent family history.  Allergies: No Known Allergies  Medications Prior to Admission  Medication Sig Dispense Refill Last Dose  . lansoprazole (PREVACID) 15 MG capsule Take 15 mg by mouth daily as needed (heartburn).   06/19/2019 at Unknown time  . NIFEdipine (ADALAT CC)  30 MG 24 hr tablet Take 1 tablet (30 mg total) by mouth daily. 30 tablet 0 06/20/2019 at Unknown time  . Prenatal Vit-Fe Fumarate-FA (PRENATAL MULTIVITAMIN) TABS tablet Take 1 tablet by mouth daily at 12 noon.   06/20/2019 at Unknown time  . FEROSUL 325 (65 Fe) MG tablet Take 325 mg by mouth 2 (two) times daily.        Review of Systems  All systems reviewed and negative except as stated in HPI  Physical Exam Blood pressure 129/73, pulse 95, temperature 98.9 F (37.2 C), temperature source Oral, resp. rate 19, height 6' (1.829 m), weight 88.5 kg, SpO2 97 %. General appearance: alert, oriented, NAD Lungs: normal respiratory effort Heart: regular rate Abdomen: soft, non-tender; gravid Extremities: No calf swelling or tenderness Presentation: cephalic by US and my SVE Fetal monitoringBaseline: 145 bpm, Variability: Good {>  6 bpm), Accelerations: Reactive and Decelerations: Absent Uterine activityFrequency: Every 4-5 minutes    Prenatal labs: ABO, Rh: --/--/A POS, A POS Performed at [facility], [address] [place], [place] [address]  [phone](12/07 0447) Antibody: NEG (12/07 0447) Rubella: Immune (10/15 0000) RPR: NON REACTIVE (12/07 0447)  HBsAg: Negative (10/15 0000)  HIV: Non-reactive (10/15 0000)  GC/Chlamydia: neg/neg 06/14/2019  GBS:   positive by Urine culture 2-hr GTT: declined, A1c 5.5% on 05/24/2019 Genetic screening:  None in records Anatomy US: no records in media or other chart. Reports had ultrasound and told infant needed follow up scan to look at "possibly enlarged heart"  Prenatal Transfer Tool  Maternal Diabetes: No Genetic Screening: Declined Maternal Ultrasounds/Referrals: Other:no records available Fetal Ultrasounds or other Referrals:  None Maternal Substance Abuse:  No Significant Maternal Medications:  None Significant Maternal Lab Results: Group B Strep positive  Results for orders placed or performed during the hospital encounter of 06/20/19 (from the past 24 hour(s))  Fern Test   Collection Time: 06/20/19  8:42 PM  Result Value Ref Range   POCT Fern Test Positive = ruptured amniotic membanes     Patient Active Problem List   Diagnosis Date Noted  . Preterm labor in third trimester 06/14/2019  . GBS bacteriuria 05/27/2019    Assessment: Alexandra PrudeStanasia Brady is a 18 y.o. G2P1001 at 7545w2d here for PPROM.  #PPROM: Grossly ruptured and fern positive. - wet prep - GN/CT - UDS - latency antibiotics - GBS positive, will be covered by latency antibiotics initially, also written for Penicillin G to start after latency ampicillin is completed  #Labor: contracting but not uncomfortable, no cervical change since discharge, overall does not appear to be in labor. Cephalic by US. Defer further tocolysis at this point, monitor on L&D for time being, if no further progress  overnight [facility]. Does report prior term 37wk vaginal delivery with last pregnancy that occurred shortly after ROM. #Pain: IV pain meds PRN, epidural if advances in labor #FWB:  Cat I #GBS/ID: Positive by Urine Cx, covered for GBS by latency abx and also written for Penicillin G to start once those are finished #COVID: swab negative on 06/14/2019, repeat 6-24h test sent as not in active labor #MOF: Bottle #MOC: Unsure #Circ: yes, outpatient  #?abnormal fetal anatomy scan: Reports ?enlargement of fetal heart with follow up scan scheduled that did not occur. Unable to view any prenatal ultrasound records beyond limited US done during last admission - Growth scan ordered  #Dating: Per patient and prenatal records LMP was unsure and dating was based on anatomy scan done at ~  24 weeks, unfortunately the result is not in her HD paperwork. Prior US here done last week appears to place patient at incorrect EDD, will call us to ensure dating is entered for EDD 08/06/2019 to ensure accurate growth measurements and call HD in morning to request records for anatomy scan on which dating is based.  Annice Needy [facility] 06/20/2019, 9:03 PM

## 2019-06-20 NOTE — MAU Note (Signed)
SROM at 1930-clear fluid.  Stated she had some blood in her mucous plug earlier but no frank bleeding now.  Some light contractions that are irregular.  + FM.

## 2019-06-21 ENCOUNTER — Encounter (HOSPITAL_COMMUNITY): Payer: Self-pay | Admitting: Obstetrics and Gynecology

## 2019-06-21 LAB — WET PREP, GENITAL
Clue Cells Wet Prep HPF POC: NONE SEEN
Sperm: NONE SEEN
Trich, Wet Prep: NONE SEEN

## 2019-06-21 LAB — GLUCOSE, CAPILLARY
Glucose-Capillary: 82 mg/dL (ref 70–99)
Glucose-Capillary: 76 mg/dL (ref 70–99)

## 2019-06-21 LAB — SARS CORONAVIRUS 2 (TAT 6-24 HRS): SARS Coronavirus 2: NEGATIVE

## 2019-06-21 LAB — RPR: RPR Ser Ql: NONREACTIVE

## 2019-06-21 MED ORDER — CALCIUM CARBONATE ANTACID 500 MG PO CHEW: 2.0000 | CHEWABLE_TABLET | ORAL | Status: DC | PRN

## 2019-06-21 MED ORDER — ACETAMINOPHEN 325 MG PO TABS
650.0000 mg | ORAL_TABLET | ORAL | Status: DC | PRN
  Administered 2019-06-21 – 2019-06-22 (×2): 650 mg via ORAL
  Filled 2019-06-21 (×2): qty 2

## 2019-06-21 MED ORDER — SODIUM CHLORIDE 0.9 % IV SOLN: 250.0000 mL | INTRAVENOUS | Status: DC | PRN

## 2019-06-21 MED ORDER — FLUCONAZOLE 150 MG PO TABS
150.0000 mg | ORAL_TABLET | Freq: Once | ORAL | Status: AC
  Administered 2019-06-21: 150 mg via ORAL
  Filled 2019-06-21: qty 1

## 2019-06-21 MED ORDER — PRENATAL MULTIVITAMIN CH
1.0000 | ORAL_TABLET | Freq: Every day | ORAL | Status: DC
  Administered 2019-06-21: 1 via ORAL
  Filled 2019-06-21: qty 1

## 2019-06-21 MED ORDER — DOCUSATE SODIUM 100 MG PO CAPS
100.0000 mg | ORAL_CAPSULE | Freq: Every day | ORAL | Status: DC
  Administered 2019-06-21: 100 mg via ORAL
  Filled 2019-06-21: qty 1

## 2019-06-21 MED ORDER — SODIUM CHLORIDE 0.9% FLUSH: 3.0000 mL | INTRAVENOUS | Status: DC | PRN

## 2019-06-21 MED ORDER — SODIUM CHLORIDE 0.9% FLUSH: 3.0000 mL | Freq: Two times a day (BID) | INTRAVENOUS | Status: DC

## 2019-06-21 MED ORDER — ZOLPIDEM TARTRATE 5 MG PO TABS
5.0000 mg | ORAL_TABLET | Freq: Every evening | ORAL | Status: DC | PRN
  Administered 2019-06-22: 5 mg via ORAL
  Filled 2019-06-21: qty 1

## 2019-06-21 NOTE — Consult Note (Signed)
Neonatology Consult  Note:  At the request of the patients obstetrician Dr. Rosana Hoes I met with Alexandra Brady who is a 18 y.o. female G2P1001 with IUP at 100w3dwho presented with PPROM.  She was recently admitted 12/7-12/9 for preterm labor, discharged on  Procardia. Pregnancy complicated by GBS bacturia, abnormal fetal anatomy scan with reports of questionable enlargement of fetal heart - growth scan ordered with final read pending.  She is currently being treated with latency antibiotics.  We reviewed initial delivery room management, including CPAP, [place], and low but certainly possible need for intubation for surfactant administration.  We discussed feeding immaturity and need for full po intake with multiple days of good weight gain and no apnea or bradycardia before discharge.  We reviewed increased risk of jaundice, infection, and temperature instability.   Discussed likely length of stay.  Thank you for allowing [place] participate in her care.  Please call with questions.  BHiginio Roger DO  Neonatologist   The total length of face-to-face or floor / unit time for this encounter was 20 minutes.  Counseling and / or coordination of care was greater than fifty percent of the time.

## 2019-06-21 NOTE — Progress Notes (Addendum)
LABOR PROGRESS NOTE  Alexandra Brady is a 18 y.o. G2P1001 at [redacted]w[redacted]d  admitted for PPROM.  Subjective: sleeping  Objective: BP (!) 103/55   Pulse 85   Temp 98.5 F (36.9 C) (Oral)   Resp 18   Ht 6' (1.829 m)   Wt 88.5 kg   SpO2 97%   BMI 26.46 kg/m  or  Vitals:   06/21/19 0142 06/21/19 0235 06/21/19 0348 06/21/19 0604  BP:   (!) 114/58 (!) 103/55  Pulse:   72 85  Resp: 16 16 16 18   Temp:  98.3 F (36.8 C) 98.5 F (36.9 C)   TempSrc:  Oral Oral   SpO2:      Weight:      Height:         Dilation: 3.5 Effacement (%): 50 Station: -2 Presentation: Vertex(confirmed via bedside US) Exam by:: Dr. Dione Plover FHT: baseline rate 120, moderate varibility, +acel, -decel Toco: irregular  Labs: Lab Results  Component Value Date   WBC 12.2 (H) 06/20/2019   HGB 8.6 (L) 06/20/2019   HCT 28.2 (L) 06/20/2019   MCV 75.8 (L) 06/20/2019   PLT 320 06/20/2019    Patient Active Problem List   Diagnosis Date Noted  . Preterm premature rupture of membranes 06/20/2019  . Preterm labor in third trimester 06/14/2019  . GBS bacteriuria 05/27/2019    Assessment / Plan: 18 y.o. G2P1001 at [redacted]w[redacted]d here for PPROM.  Labor: widely spaced contractions, patient currently sleeping, does not appear to be progressing in labor at present, defer cervical exam until day shift. Fetal Wellbeing:  Cat I Pain Control:  IV pain meds PRN GBS: positive by Urine, on ampicillin for latency abx/GBS coverage Anticipated MOD:  SVD  #PPROM: Grossly ruptured and fern positive. - wet prep>positive for yeast, treated w fluconazole - GN/CT>in process - UDS>neg - on latency antibiotics - GBS positive, will cont ampicillin indefinitely  #Scant prenatal care: no GDM testing done, initial CBG 102>82, cont q4h check for 1-2 more checks, can d/c if cont to be normal  #?abnormal fetal anatomy scan: Reports ?enlargement of fetal heart with follow up scan scheduled that did not occur. Unable to view any prenatal  ultrasound records beyond limited US done during last admission - Growth scan ordered, final read pending though cardiac structures listed as normal  #Dating: Per patient and prenatal records LMP was unsure and dating was based on anatomy scan done at ~24 weeks, unfortunately the result is not in her HD paperwork. Prior US here done last week appears to place patient at incorrect EDD, will call us to ensure dating is entered for EDD 08/06/2019 to ensure accurate growth measurements and call HD in morning to request records for anatomy scan on which dating is based. - dating either 1% vs 12% depending on EDD used, await HD records  Augustin Coupe, MD/[facility] OB Fellow  06/21/2019, 6:40 AM    Patient doing well, still leaking fluid but no contractions overnight. Will transfer to [facility]. Reviewed plan for latency abx, NICU consult, delivery at 34 weeks. Will obtain records from [facility]. Answered all questions.  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

## 2019-06-21 NOTE — Progress Notes (Signed)
Pt sitting up to eat breakfast.  FHR not tracing

## 2019-06-22 ENCOUNTER — Encounter (HOSPITAL_COMMUNITY): Payer: Self-pay | Admitting: Obstetrics and Gynecology

## 2019-06-22 DIAGNOSIS — O99824 Streptococcus B carrier state complicating childbirth: Secondary | ICD-10-CM

## 2019-06-22 LAB — CBC
HCT: 31.3 % — ABNORMAL LOW (ref 36.0–46.0)
Hemoglobin: 9.2 g/dL — ABNORMAL LOW (ref 12.0–15.0)
MCH: 22.3 pg — ABNORMAL LOW (ref 26.0–34.0)
MCHC: 29.4 g/dL — ABNORMAL LOW (ref 30.0–36.0)
MCV: 76 fL — ABNORMAL LOW (ref 80.0–100.0)
Platelets: 314 10*3/uL (ref 150–400)
RBC: 4.12 MIL/uL (ref 3.87–5.11)
RDW: 16.6 % — ABNORMAL HIGH (ref 11.5–15.5)
WBC: 15 10*3/uL — ABNORMAL HIGH (ref 4.0–10.5)
nRBC: 0.3 % — ABNORMAL HIGH (ref 0.0–0.2)

## 2019-06-22 MED ORDER — ONDANSETRON HCL 4 MG/2ML IJ SOLN: 4.0000 mg | INTRAMUSCULAR | Status: DC | PRN

## 2019-06-22 MED ORDER — LACTATED RINGERS IV SOLN: INTRAVENOUS | Status: DC

## 2019-06-22 MED ORDER — ONDANSETRON HCL 4 MG PO TABS: 4.0000 mg | ORAL_TABLET | ORAL | Status: DC | PRN

## 2019-06-22 MED ORDER — DIBUCAINE (PERIANAL) 1 % EX OINT: 1.0000 "application " | TOPICAL_OINTMENT | CUTANEOUS | Status: DC | PRN

## 2019-06-22 MED ORDER — MAGNESIUM HYDROXIDE 400 MG/5ML PO SUSP: 30.0000 mL | ORAL | Status: DC | PRN

## 2019-06-22 MED ORDER — OXYCODONE HCL 5 MG PO TABS: 5.0000 mg | ORAL_TABLET | ORAL | Status: DC | PRN

## 2019-06-22 MED ORDER — FENTANYL-BUPIVACAINE-NACL 0.5-0.125-0.9 MG/250ML-% EP SOLN: 12.0000 mL/h | EPIDURAL | Status: DC | PRN

## 2019-06-22 MED ORDER — WITCH HAZEL-GLYCERIN EX PADS: 1.0000 "application " | MEDICATED_PAD | CUTANEOUS | Status: DC | PRN

## 2019-06-22 MED ORDER — OXYTOCIN BOLUS FROM INFUSION
500.0000 mL | Freq: Once | INTRAVENOUS | Status: AC
  Administered 2019-06-22: 500 mL via INTRAVENOUS

## 2019-06-22 MED ORDER — ACETAMINOPHEN 325 MG PO TABS: 650.0000 mg | ORAL_TABLET | ORAL | Status: DC | PRN

## 2019-06-22 MED ORDER — PHENYLEPHRINE 40 MCG/ML (10ML) SYRINGE FOR IV PUSH (FOR BLOOD PRESSURE SUPPORT): 80.0000 ug | PREFILLED_SYRINGE | INTRAVENOUS | Status: DC | PRN

## 2019-06-22 MED ORDER — LACTATED RINGERS IV BOLUS
500.0000 mL | Freq: Once | INTRAVENOUS | Status: AC
  Administered 2019-06-22: 500 mL via INTRAVENOUS

## 2019-06-22 MED ORDER — LACTATED RINGERS IV SOLN: 500.0000 mL | INTRAVENOUS | Status: DC | PRN

## 2019-06-22 MED ORDER — PRENATAL MULTIVITAMIN CH
1.0000 | ORAL_TABLET | Freq: Every day | ORAL | Status: DC
  Administered 2019-06-22 – 2019-06-24 (×3): 1 via ORAL
  Filled 2019-06-22 (×3): qty 1

## 2019-06-22 MED ORDER — COCONUT OIL OIL: 1.0000 "application " | TOPICAL_OIL | Status: DC | PRN

## 2019-06-22 MED ORDER — OXYTOCIN 40 UNITS IN NORMAL SALINE INFUSION - SIMPLE MED
2.5000 [IU]/h | INTRAVENOUS | Status: DC
  Administered 2019-06-22: 2.5 [IU]/h via INTRAVENOUS
  Filled 2019-06-22: qty 1000

## 2019-06-22 MED ORDER — OXYCODONE HCL 5 MG PO TABS: 10.0000 mg | ORAL_TABLET | ORAL | Status: DC | PRN

## 2019-06-22 MED ORDER — IBUPROFEN 600 MG PO TABS
600.0000 mg | ORAL_TABLET | Freq: Four times a day (QID) | ORAL | Status: DC
  Administered 2019-06-22 – 2019-06-24 (×8): 600 mg via ORAL
  Filled 2019-06-22 (×9): qty 1

## 2019-06-22 MED ORDER — ACETAMINOPHEN 325 MG PO TABS
650.0000 mg | ORAL_TABLET | ORAL | Status: DC | PRN
  Administered 2019-06-22: 650 mg via ORAL
  Filled 2019-06-22: qty 2

## 2019-06-22 MED ORDER — SOD CITRATE-CITRIC ACID 500-334 MG/5ML PO SOLN: 30.0000 mL | ORAL | Status: DC | PRN

## 2019-06-22 MED ORDER — FENTANYL CITRATE (PF) 100 MCG/2ML IJ SOLN: 50.0000 ug | INTRAMUSCULAR | Status: DC | PRN

## 2019-06-22 MED ORDER — ONDANSETRON HCL 4 MG/2ML IJ SOLN: 4.0000 mg | Freq: Four times a day (QID) | INTRAMUSCULAR | Status: DC | PRN

## 2019-06-22 MED ORDER — BENZOCAINE-MENTHOL 20-0.5 % EX AERO: 1.0000 "application " | INHALATION_SPRAY | CUTANEOUS | Status: DC | PRN

## 2019-06-22 MED ORDER — DIPHENHYDRAMINE HCL 25 MG PO CAPS: 25.0000 mg | ORAL_CAPSULE | Freq: Four times a day (QID) | ORAL | Status: DC | PRN

## 2019-06-22 MED ORDER — PHENYLEPHRINE 40 MCG/ML (10ML) SYRINGE FOR IV PUSH (FOR BLOOD PRESSURE SUPPORT)
PREFILLED_SYRINGE | INTRAVENOUS | Status: AC
  Filled 2019-06-22: qty 10

## 2019-06-22 MED ORDER — EPHEDRINE 5 MG/ML INJ: 10.0000 mg | INTRAVENOUS | Status: DC | PRN

## 2019-06-22 MED ORDER — DIPHENHYDRAMINE HCL 50 MG/ML IJ SOLN: 12.5000 mg | INTRAMUSCULAR | Status: DC | PRN

## 2019-06-22 MED ORDER — SENNOSIDES-DOCUSATE SODIUM 8.6-50 MG PO TABS
2.0000 | ORAL_TABLET | ORAL | Status: DC
  Administered 2019-06-22 – 2019-06-24 (×2): 2 via ORAL
  Filled 2019-06-22 (×2): qty 2

## 2019-06-22 MED ORDER — SIMETHICONE 80 MG PO CHEW
80.0000 mg | CHEWABLE_TABLET | ORAL | Status: DC | PRN
  Filled 2019-06-22: qty 1

## 2019-06-22 MED ORDER — LIDOCAINE HCL (PF) 1 % IJ SOLN: 30.0000 mL | INTRAMUSCULAR | Status: DC | PRN

## 2019-06-22 MED ORDER — FENTANYL-BUPIVACAINE-NACL 0.5-0.125-0.9 MG/250ML-% EP SOLN
EPIDURAL | Status: AC
  Filled 2019-06-22: qty 250

## 2019-06-22 MED ORDER — LACTATED RINGERS IV SOLN: 500.0000 mL | Freq: Once | INTRAVENOUS | Status: DC

## 2019-06-22 NOTE — Discharge Summary (Signed)
Postpartum Discharge Summary     Patient Name: Alexandra Brady DOB: 02/05/2001 MRN: [id number]  Date of admission: 06/20/2019 Delivering Provider: Clarnce Flock   Date of discharge: 06/24/2019  Admitting diagnosis: Preterm premature rupture of membranes [O42.919] Intrauterine pregnancy: [redacted]w[redacted]d    Secondary diagnosis:  Active Problems:   GBS bacteriuria   Preterm labor in third trimester   Preterm premature rupture of membranes   Preterm delivery   [redacted] weeks gestation of pregnancy  Additional problems: None     Discharge diagnosis: Preterm Pregnancy Delivered                                                                                                Post partum procedures:None  Augmentation: None  Complications: RWUJ>81hours  Hospital course:  Onset of Labor With Vaginal Delivery     18y.o. yo G2P1001 at 311w4das admitted in Latent Labor on 06/20/2019. Patient had an uncomplicated labor course as follows: Patient arrived on 06/20/2019 at 3.5 cm dilation after PPROM for clear. She was initially monitored on L&D but after making no progress was moved to antepartum for monitoring. Early on 06/22/2019 patient started to have more painful contractions and was found to be 6 cm, she was moved to L&D and quickly had uncomplicated NSVD Membrane Rupture Time/Date: 7:30 PM ,06/20/2019   Intrapartum Procedures: Episiotomy: None [1]                                         Lacerations:  Periurethral [8]  Patient had a delivery of a Viable infant. 06/22/2019  Information for the patient's newborn:  McDandria, Griego0[[id number]]Delivery Method: Vaginal, Spontaneous(Filed from Delivery Summary)     Pateint had an uncomplicated postpartum course.  She is ambulating, tolerating a regular diet, passing flatus, and urinating well. Declined birth control. Patient is discharged home in stable condition on 06/24/19.  Delivery time: 5:47 AM    Magnesium Sulfate received: No BMZ  received: Yes Rhophylac:N/A MMR:N/A Transfusion:No  Physical exam  Vitals:   06/22/19 2030 06/23/19 0550 06/23/19 1218 06/23/19 2020  BP: 122/64 109/67 107/72 (!) 110/59  Pulse: 72 72  86  Resp: 18 18 17 18   Temp: 98.3 F (36.8 C) 98 F (36.7 C) 97.8 F (36.6 C) 98.2 F (36.8 C)  TempSrc: Oral  Oral Oral  SpO2: 100% 100%  96%  Weight:      Height:       General: alert, cooperative and no distress Lochia: appropriate Uterine Fundus: firm DVT Evaluation: No evidence of DVT seen on physical exam. Labs: Lab Results  Component Value Date   WBC 15.0 (H) 06/22/2019   HGB 9.2 (L) 06/22/2019   HCT 31.3 (L) 06/22/2019   MCV 76.0 (L) 06/22/2019   PLT 314 06/22/2019   CMP Latest Ref Rng & Units 06/20/2019  Glucose 70 - 99 mg/dL 105(H)  BUN 6 - 20 mg/dL 8  Creatinine 0.44 - 1.00 mg/dL 0.48  Sodium 135 - 145  mmol/L 135  Potassium 3.5 - 5.1 mmol/L 3.5  Chloride 98 - 111 mmol/L 106  CO2 22 - 32 mmol/L 19(L)  Calcium 8.9 - 10.3 mg/dL 9.0  Total Protein 6.5 - 8.1 g/dL 6.8  Total Bilirubin 0.3 - 1.2 mg/dL 0.5  Alkaline Phos 38 - 126 U/L 93  AST 15 - 41 U/L 19  ALT 0 - 44 U/L 18    Discharge instruction: per After Visit Summary and "Baby and Me Booklet".  After visit meds:  Allergies as of 06/24/2019   No Known Allergies     Medication List    STOP taking these medications   NIFEdipine 30 MG 24 hr tablet Commonly known as: ADALAT CC   prenatal multivitamin Tabs tablet     TAKE these medications   acetaminophen 325 MG tablet Commonly known as: Tylenol Take 2 tablets (650 mg total) by mouth every 6 (six) hours as needed (for pain scale < 4).   FeroSul 325 (65 FE) MG tablet Generic drug: ferrous sulfate Take 325 mg by mouth 2 (two) times daily.   ibuprofen 600 MG tablet Commonly known as: ADVIL Take 1 tablet (600 mg total) by mouth every 6 (six) hours as needed.   lansoprazole 15 MG capsule Commonly known as: PREVACID Take 15 mg by mouth daily as needed  (heartburn).       Diet: routine diet  Activity: Advance as tolerated. Pelvic rest for 6 weeks.   Outpatient follow up:6 weeks Follow up Appt:No future appointments. Follow up Visit:   Please schedule this patient for Postpartum visit in: 6 weeks with the following provider: Any provider For C/S patients schedule nurse incision check in weeks 2 weeks: no High risk pregnancy complicated by: Preterm labor, PPROM, preterm delivery Delivery mode:  SVD Anticipated Birth Control:  other/unsure PP Procedures needed: none  Schedule Integrated BH visit: no      Newborn Data: Live born female  Birth Weight:  2020g APGAR: 53, 9  Newborn Delivery   Birth date/time: 06/22/2019 05:47:00 Delivery type: Vaginal, Spontaneous      Baby Feeding: Bottle Disposition:rooming in   06/24/2019 Chauncey Mann, MD

## 2019-06-22 NOTE — Progress Notes (Signed)
Order to transfer pt to [place].. Report given to Madelaine Etienne.

## 2019-06-22 NOTE — Progress Notes (Signed)
Patient ID: Alexandra Brady, female   DOB: 01-15-2001, 18 y.o.   MRN: [id number]  CTSP by nursing secondary to ut ctx. Pt reports ut ctx off/on for several hours but has increased in intensity over the last hour or so. Pt with known PROM. No vaginal bleeding but LOF  PE AF VSS SVE 6-7/90%/-1  A/P IUP 33 4/7 weeks        PROM, s/p BMZ x 2  Pt now with active labor. Will transfer to L & D for management of labor. Discussed with pt. L & D charge nurse notified.

## 2019-06-23 LAB — SURGICAL PATHOLOGY

## 2019-06-23 MED ORDER — FERROUS SULFATE 325 (65 FE) MG PO TABS
325.0000 mg | ORAL_TABLET | Freq: Every day | ORAL | Status: DC
  Administered 2019-06-23 – 2019-06-24 (×2): 325 mg via ORAL
  Filled 2019-06-23 (×2): qty 1

## 2019-06-23 NOTE — Progress Notes (Signed)
Post Partum Day 1 Subjective: Patient reports feeling well. She is tolerating PO. Ambulating and urinating without difficulty. Lochia minimal.  Objective: Blood pressure 122/64, pulse 72, temperature 98.3 F (36.8 C), temperature source Oral, resp. rate 18, height 6' (1.829 m), weight 88.5 kg, SpO2 100 %, unknown if currently breastfeeding.  Physical Exam:  General: alert, cooperative and appears stated age Lochia: appropriate Uterine Fundus: firm DVT Evaluation: No evidence of DVT seen on physical exam.  Recent Labs    06/20/19 2126 06/22/19 0505  HGB 8.6* 9.2*  HCT 28.2* 31.3*    Assessment/Plan: Plan for discharge tomorrow  Formula feeding Declines birth control Vitals stable  Hgb stable; will start PO iron for baseline anemia   LOS: 3 days   Alexandra Brady 06/23/2019, 3:20 AM

## 2019-06-23 NOTE — Progress Notes (Signed)
Patient screened out for psychosocial assessment since none of the following apply:  Psychosocial stressors documented in mother or baby's chart  Gestation less than 32 weeks  Code at delivery   Infant with anomalies Please contact the Clinical Social Worker if specific needs arise, by MOB's request, or if MOB scores greater than 9/yes to question 10 on [place] Postpartum Depression Screen.  Lenoria Narine, LCSW Clinical Social Worker [facility] Cell#: [phone]     

## 2019-06-24 DIAGNOSIS — Z3A33 33 weeks gestation of pregnancy: Secondary | ICD-10-CM

## 2019-06-24 MED ORDER — IBUPROFEN 600 MG PO TABS: 600.0000 mg | ORAL_TABLET | Freq: Four times a day (QID) | ORAL | 0 refills | Status: DC | PRN

## 2019-06-24 MED ORDER — ACETAMINOPHEN 325 MG PO TABS: 650.0000 mg | ORAL_TABLET | Freq: Four times a day (QID) | ORAL | 0 refills | Status: DC | PRN

## 2019-09-14 ENCOUNTER — Ambulatory Visit (HOSPITAL_COMMUNITY)
Admission: EM | Admit: 2019-09-14 | Discharge: 2019-09-14 | Disposition: A | Discharge status: Home / Self Care | Payer: Medicaid Other | Attending: Physician Assistant | Admitting: Physician Assistant

## 2019-09-14 ENCOUNTER — Encounter (HOSPITAL_COMMUNITY): Payer: Self-pay

## 2019-09-14 ENCOUNTER — Other Ambulatory Visit: Payer: Self-pay

## 2019-09-14 DIAGNOSIS — Z20822 Contact with and (suspected) exposure to covid-19: Secondary | ICD-10-CM | POA: Diagnosis not present

## 2019-09-14 DIAGNOSIS — B349 Viral infection, unspecified: Secondary | ICD-10-CM | POA: Diagnosis not present

## 2019-09-14 MED ORDER — IBUPROFEN 800 MG PO TABS
ORAL_TABLET | ORAL | Status: AC
  Filled 2019-09-14: qty 1

## 2019-09-14 MED ORDER — ONDANSETRON 4 MG PO TBDP
4.0000 mg | ORAL_TABLET | Freq: Once | ORAL | Status: AC
  Administered 2019-09-14: 4 mg via ORAL

## 2019-09-14 MED ORDER — IBUPROFEN 800 MG PO TABS
800.0000 mg | ORAL_TABLET | Freq: Once | ORAL | Status: AC
  Administered 2019-09-14: 800 mg via ORAL

## 2019-09-14 MED ORDER — ONDANSETRON 4 MG PO TBDP
ORAL_TABLET | ORAL | Status: AC
  Filled 2019-09-14: qty 1

## 2019-09-14 MED ORDER — ACETAMINOPHEN 500 MG PO TABS: 1000.0000 mg | ORAL_TABLET | Freq: Four times a day (QID) | ORAL | 0 refills | Status: AC | PRN

## 2019-09-14 MED ORDER — ONDANSETRON HCL 4 MG PO TABS: 4.0000 mg | ORAL_TABLET | Freq: Three times a day (TID) | ORAL | 0 refills | Status: AC | PRN

## 2019-09-14 MED ORDER — IBUPROFEN 600 MG PO TABS: 600.0000 mg | ORAL_TABLET | Freq: Three times a day (TID) | ORAL | 0 refills | Status: AC | PRN

## 2019-09-14 NOTE — Discharge Instructions (Signed)
Take the medications as prescribe for headache  Take the zofran for nausea  Drink plenty of water and include gatorade or pedialyte- avoid red, orange or purple colors.   You may start imodium tomorrow if continuing to have loose stools.  If you have severe diarrhea with blood, severe abdominal pain, vomit blood or have high fever please go to the Emergency department  If your Covid-19 test is positive, you will receive a phone call from [facility] regarding your results. Negative test results are not called. Both positive and negative results area always visible on MyChart. If you do not have a MyChart account, sign up instructions are in your discharge papers.   Persons who are directed to care for themselves at home may discontinue isolation under the following conditions:   At least 10 days have passed since symptom onset and  At least 24 hours have passed without running a fever (this means without the use of fever-reducing medications) and  Other symptoms have improved.  Persons infected with COVID-19 who never develop symptoms may discontinue isolation and other precautions 10 days after the date of their first positive COVID-19 test.

## 2019-09-14 NOTE — ED Triage Notes (Signed)
Pt c/o chills, HA, body aches, fatigue and abd pain with nausea, diarrhea onset yesterday. Denies sore throat, congestion, cough, loss of taste/smell.

## 2019-09-16 LAB — NOVEL CORONAVIRUS, NAA (HOSP ORDER, SEND-OUT TO REF LAB; TAT 18-24 HRS): SARS-CoV-2, NAA: NOT DETECTED

## 2021-05-27 IMAGING — US US MFM OB COMP +14 WKS
1 series · 14 of 28 positions shown · non-contrast
Comparison: none

[Series 1: us mfm ob comp +14 wks · 52 acquisitions, 14 frames shown]
[im 2/52]
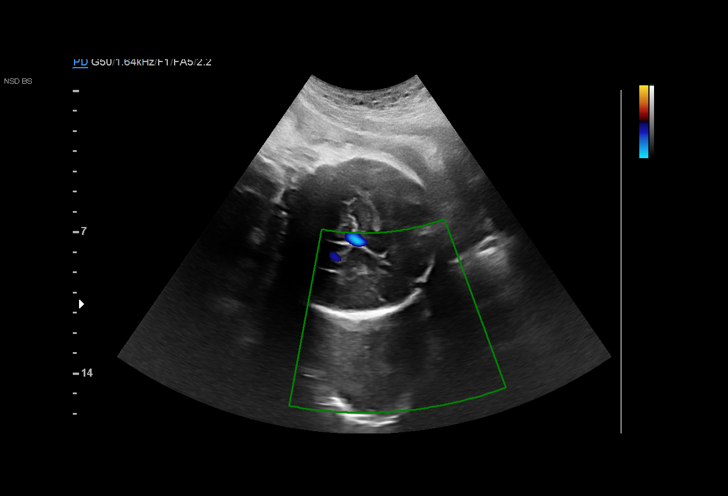
[im 6/52]
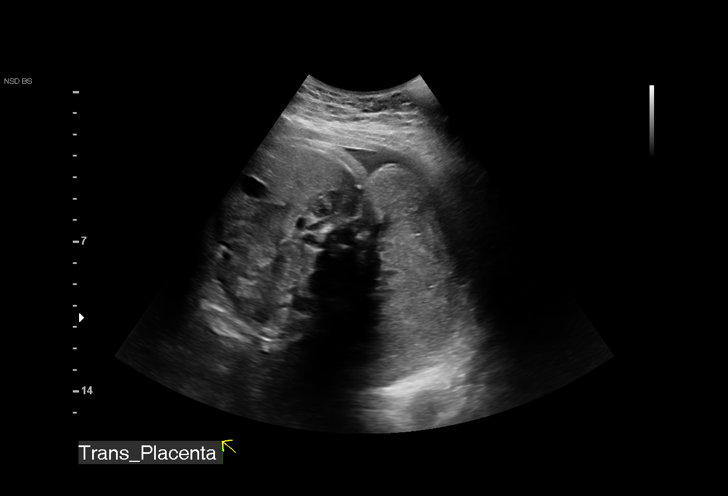
[im 10/52]
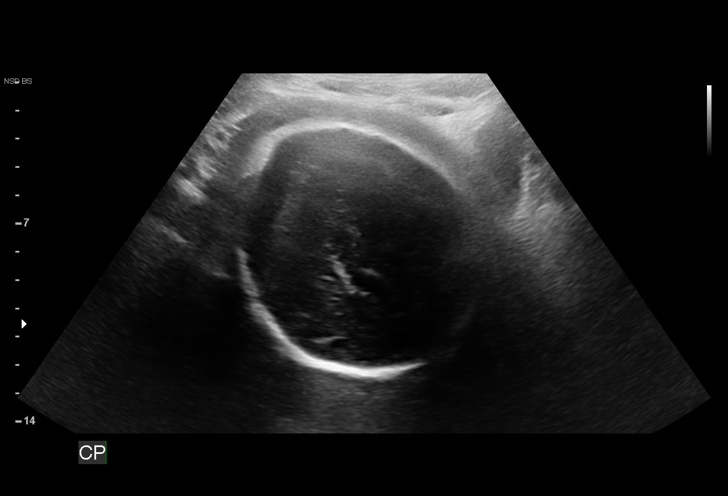
[im 14/52]
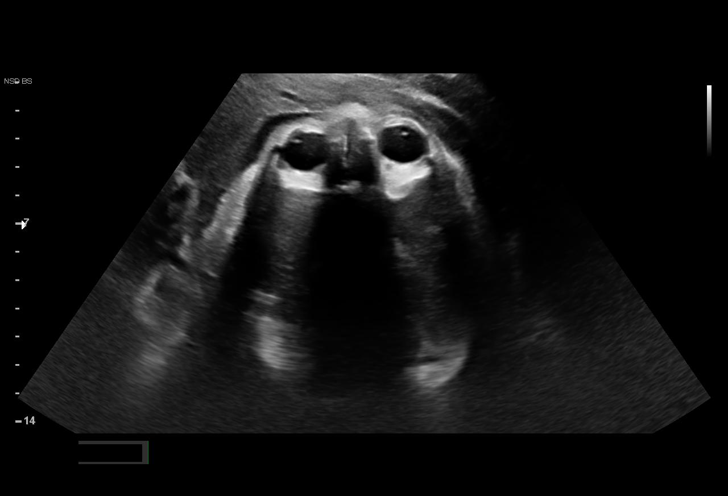
[im 18/52]
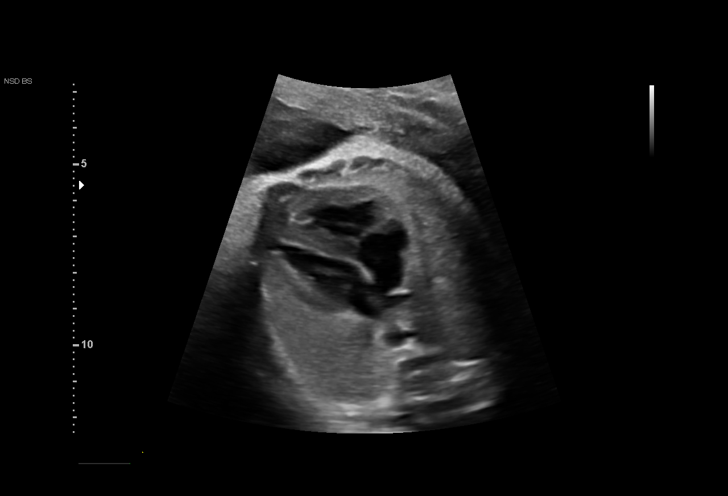
[im 21/52]
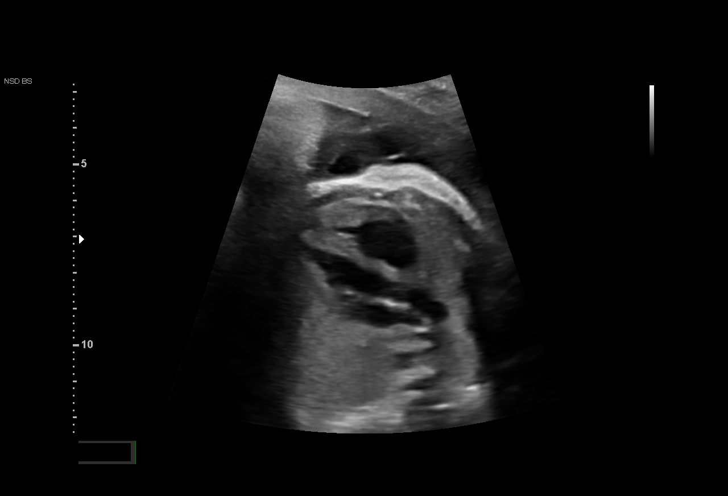
[im 25/52]
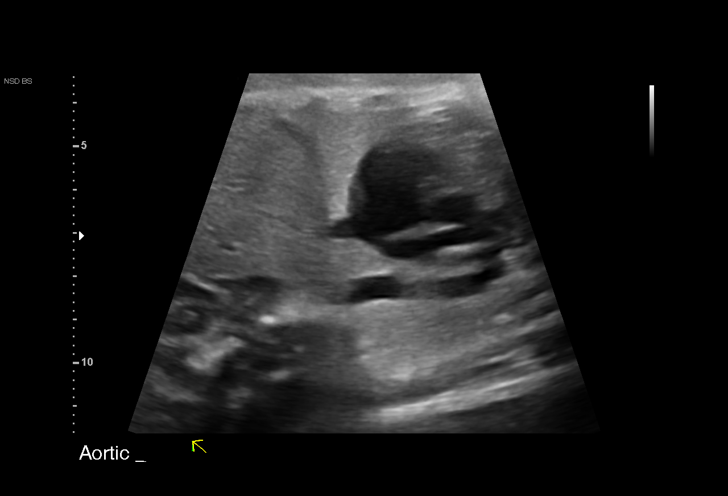
[im 29/52]
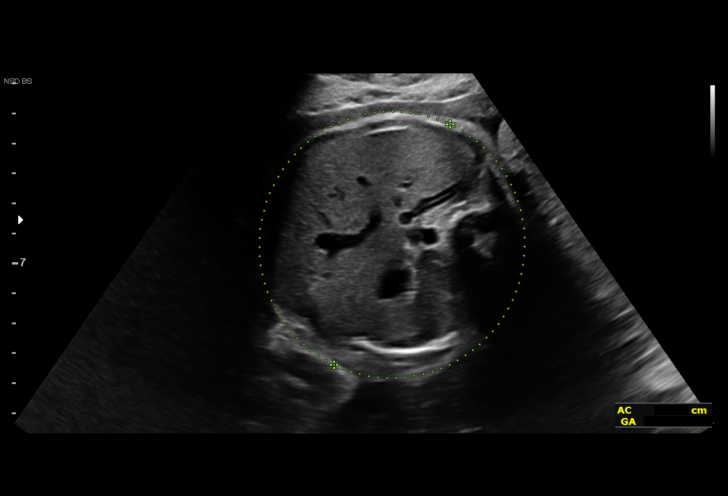
[im 33/52]
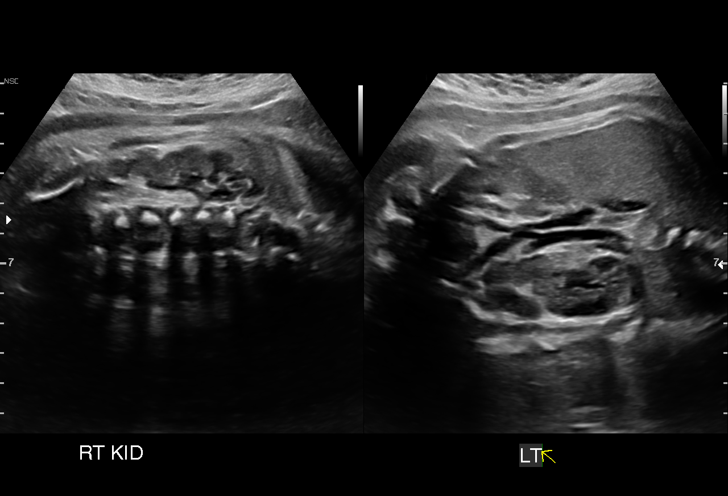
[im 36/52]
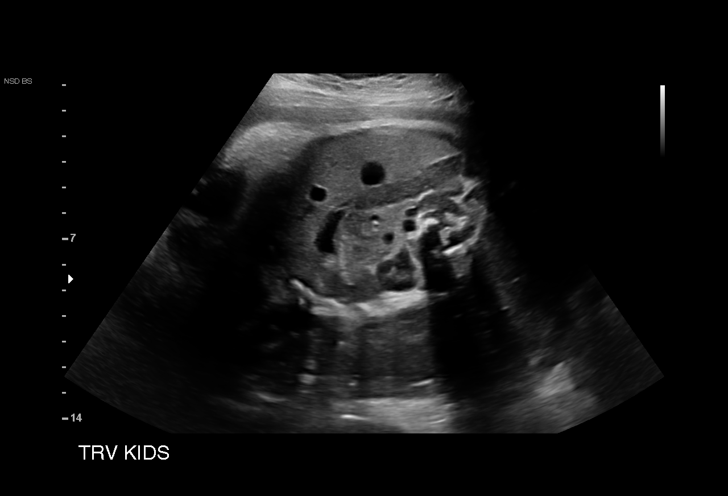
[im 40/52]
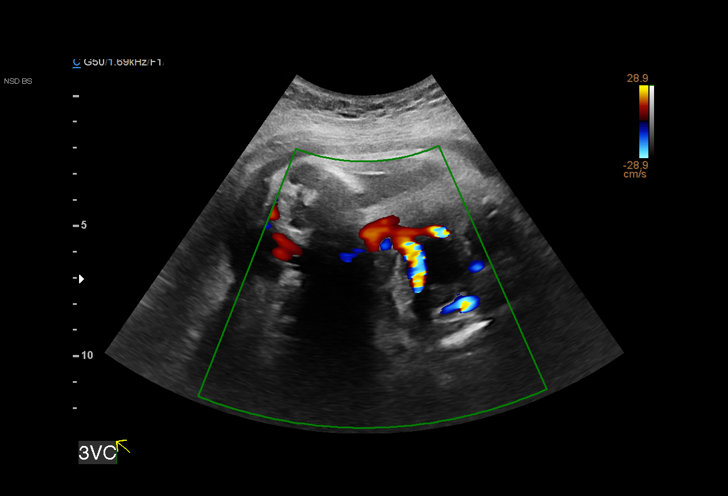
[im 44/52]
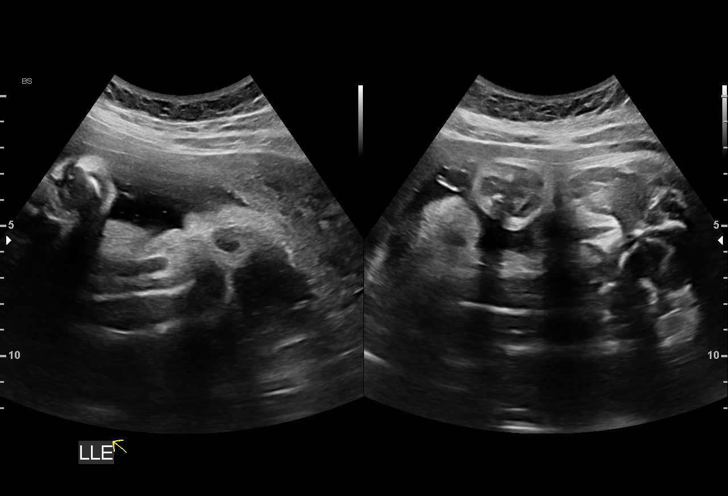
[im 48/52]
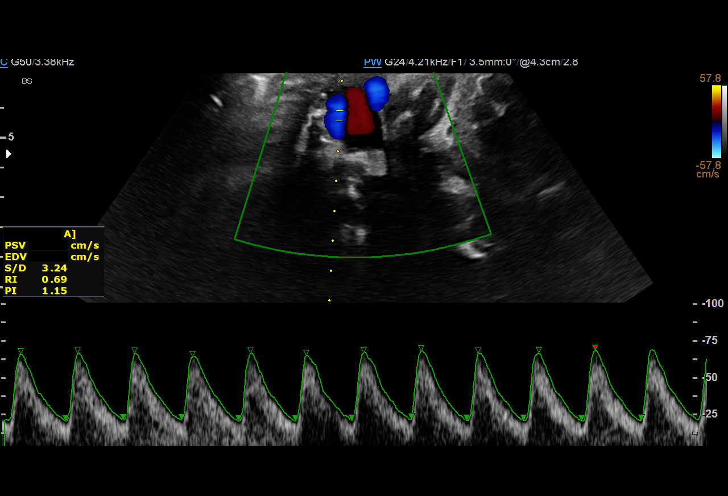
[im 52/52]
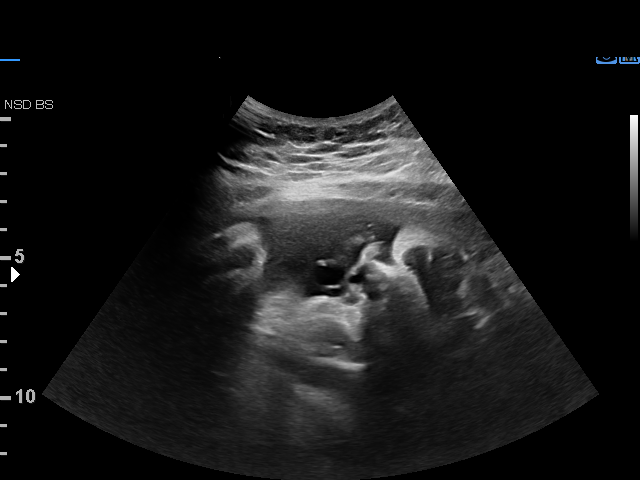

[14 of 28 positions shown; findings below may reference images not displayed]

----------------------------------------------------------------------

 ----------------------------------------------------------------------
Indications

  Premature rupture of membranes - leaking
  fluid (Positive Fern)
  Encounter for other antenatal screening
  follow-up
  Late to prenatal care, third trimester
  33 weeks gestation of pregnancy
 ----------------------------------------------------------------------
Fetal Evaluation

 Num Of Fetuses:         1
 Fetal Heart Rate(bpm):  148
 Cardiac Activity:       Observed
 Presentation:           Cephalic
 Placenta:               Posterior
 P. Cord Insertion:      Previously Visualized

 Amniotic Fluid
 AFI FV:      Subjectively decreased due to ROM

 AFI Sum(cm)     %Tile       Largest Pocket(cm)
 4.06            < 3

 RUQ(cm)       RLQ(cm)       LUQ(cm)        LLQ(cm)
 1.57          1.1           1.39           0
Biometry

 BPD:      83.1  mm     G. Age:  33w 3d         49  %    CI:        77.23   %    70 - 86
                                                         FL/HC:      20.2   %    19.9 -
 HC:      299.4  mm     G. Age:  33w 1d         13  %    HC/AC:      1.07        0.96 -
 AC:       279   mm     G. Age:  32w 0d         16  %    FL/BPD:     72.7   %    71 - 87
 FL:       60.4  mm     G. Age:  31w 3d          5  %    FL/AC:      21.6   %    20 - 24
 HUM:      54.7  mm     G. Age:  31w 6d         29  %

 Est. FW:    6555  gm      4 lb 3 oz     12  %
OB History

 Gravidity:    2         Term:   1        Prem:   0        SAB:   0
 TOP:          0       Ectopic:  0        Living: 1
Gestational Age

 Clinical EDD:  33w 2d                                        EDD:   08/06/19
 U/S Today:     32w 4d                                        EDD:   08/11/19
 Best:          33w 2d     Det. By:  Clinical EDD             EDD:   08/06/19
Anatomy

 Cranium:               Appears normal         Aortic Arch:            Appears normal
 Cavum:                 Not well visualized    Ductal Arch:            Appears normal
 Ventricles:            Not well visualized    Diaphragm:              Appears normal
 Choroid Plexus:        Appears normal         Stomach:                Appears normal, left
                                                                       sided
 Cerebellum:            Not well visualized    Abdomen:                Appears normal
 Posterior Fossa:       Not well visualized    Abdominal Wall:         Not well visualized
 Nuchal Fold:           Not applicable (>20    Cord Vessels:           Appears normal (3
                        wks GA)                                        vessel cord)
 Face:                  Orbits appear          Kidneys:                Appear normal
                        normal
 Lips:                  Not well visualized    Bladder:                Appears normal
 Palate:                Not well visualized    Spine:                  Not well visualized
 Heart:                 Appears normal         Upper Extremities:      Present
                        (4CH, axis, and
                        situs)
 RVOT:                  Appears normal         Lower Extremities:      Present
 LVOT:                  Appears normal

 Other:  Gender not well visualized. Technically difficult due to advanced GA
         and fetal position.
Doppler - Fetal Vessels

 Umbilical Artery
  S/D     %tile     RI              PI                     ADFV    RDFV
 2.99       80   0.67             1.04                        No      No

Comments

 This patient was admitted due to PPROM.

 The overall EFW measures at the 12th percentile for her
 gestational age based on a working EDC August 06, 2019.

 The total AFI was 4.06 cm.
